# Patient Record
Sex: Female | Born: 1950 | Race: White | Hispanic: No | Marital: Married | Smoking: Former smoker
Health system: Southern US, Community
[De-identification: ages and names within clinical notes are randomized; demographics above are authoritative.]

## PROBLEM LIST (undated history)

## (undated) DIAGNOSIS — C9110 Chronic lymphocytic leukemia of B-cell type not having achieved remission: Secondary | ICD-10-CM

## (undated) DIAGNOSIS — I10 Essential (primary) hypertension: Secondary | ICD-10-CM

## (undated) DIAGNOSIS — I4891 Unspecified atrial fibrillation: Secondary | ICD-10-CM

## (undated) HISTORY — PX: WRIST SURGERY: SHX841

## (undated) HISTORY — PX: ABDOMINAL HYSTERECTOMY: SHX81

---

## 2018-06-02 ENCOUNTER — Inpatient Hospital Stay (HOSPITAL_COMMUNITY)
Admission: EM | Admit: 2018-06-02 | Discharge: 2018-06-07 | DRG: 378 | Disposition: A | Discharge status: Home / Self Care | Payer: Medicare Other | Attending: Internal Medicine | Admitting: Internal Medicine

## 2018-06-02 ENCOUNTER — Encounter (HOSPITAL_COMMUNITY): Payer: Self-pay | Admitting: Emergency Medicine

## 2018-06-02 ENCOUNTER — Other Ambulatory Visit: Payer: Self-pay

## 2018-06-02 ENCOUNTER — Emergency Department (HOSPITAL_COMMUNITY): Payer: Medicare Other

## 2018-06-02 DIAGNOSIS — C9111 Chronic lymphocytic leukemia of B-cell type in remission: Secondary | ICD-10-CM | POA: Diagnosis present

## 2018-06-02 DIAGNOSIS — K5721 Diverticulitis of large intestine with perforation and abscess with bleeding: Principal | ICD-10-CM | POA: Diagnosis present

## 2018-06-02 DIAGNOSIS — Z6841 Body Mass Index (BMI) 40.0 and over, adult: Secondary | ICD-10-CM

## 2018-06-02 DIAGNOSIS — K6389 Other specified diseases of intestine: Secondary | ICD-10-CM

## 2018-06-02 DIAGNOSIS — E876 Hypokalemia: Secondary | ICD-10-CM | POA: Diagnosis present

## 2018-06-02 DIAGNOSIS — I10 Essential (primary) hypertension: Secondary | ICD-10-CM | POA: Diagnosis present

## 2018-06-02 DIAGNOSIS — R0981 Nasal congestion: Secondary | ICD-10-CM | POA: Diagnosis present

## 2018-06-02 DIAGNOSIS — R109 Unspecified abdominal pain: Secondary | ICD-10-CM | POA: Diagnosis present

## 2018-06-02 DIAGNOSIS — D638 Anemia in other chronic diseases classified elsewhere: Secondary | ICD-10-CM | POA: Diagnosis present

## 2018-06-02 DIAGNOSIS — R7881 Bacteremia: Secondary | ICD-10-CM | POA: Diagnosis present

## 2018-06-02 DIAGNOSIS — K921 Melena: Secondary | ICD-10-CM | POA: Diagnosis not present

## 2018-06-02 DIAGNOSIS — Z79899 Other long term (current) drug therapy: Secondary | ICD-10-CM

## 2018-06-02 DIAGNOSIS — J45901 Unspecified asthma with (acute) exacerbation: Secondary | ICD-10-CM | POA: Diagnosis present

## 2018-06-02 DIAGNOSIS — Z90710 Acquired absence of both cervix and uterus: Secondary | ICD-10-CM

## 2018-06-02 DIAGNOSIS — I482 Chronic atrial fibrillation, unspecified: Secondary | ICD-10-CM | POA: Diagnosis present

## 2018-06-02 DIAGNOSIS — I119 Hypertensive heart disease without heart failure: Secondary | ICD-10-CM | POA: Diagnosis present

## 2018-06-02 DIAGNOSIS — R739 Hyperglycemia, unspecified: Secondary | ICD-10-CM | POA: Diagnosis present

## 2018-06-02 DIAGNOSIS — Z87891 Personal history of nicotine dependence: Secondary | ICD-10-CM

## 2018-06-02 DIAGNOSIS — R590 Localized enlarged lymph nodes: Secondary | ICD-10-CM | POA: Diagnosis present

## 2018-06-02 DIAGNOSIS — R000 Tachycardia, unspecified: Secondary | ICD-10-CM | POA: Diagnosis present

## 2018-06-02 DIAGNOSIS — Z8542 Personal history of malignant neoplasm of other parts of uterus: Secondary | ICD-10-CM

## 2018-06-02 DIAGNOSIS — I4891 Unspecified atrial fibrillation: Secondary | ICD-10-CM

## 2018-06-02 DIAGNOSIS — K5792 Diverticulitis of intestine, part unspecified, without perforation or abscess without bleeding: Secondary | ICD-10-CM

## 2018-06-02 DIAGNOSIS — R1084 Generalized abdominal pain: Secondary | ICD-10-CM

## 2018-06-02 DIAGNOSIS — Z7901 Long term (current) use of anticoagulants: Secondary | ICD-10-CM

## 2018-06-02 DIAGNOSIS — B967 Clostridium perfringens [C. perfringens] as the cause of diseases classified elsewhere: Secondary | ICD-10-CM | POA: Diagnosis present

## 2018-06-02 DIAGNOSIS — R112 Nausea with vomiting, unspecified: Secondary | ICD-10-CM | POA: Diagnosis present

## 2018-06-02 DIAGNOSIS — Z800 Family history of malignant neoplasm of digestive organs: Secondary | ICD-10-CM

## 2018-06-02 DIAGNOSIS — K5720 Diverticulitis of large intestine with perforation and abscess without bleeding: Secondary | ICD-10-CM | POA: Diagnosis not present

## 2018-06-02 DIAGNOSIS — Z7951 Long term (current) use of inhaled steroids: Secondary | ICD-10-CM

## 2018-06-02 HISTORY — DX: Unspecified atrial fibrillation: I48.91

## 2018-06-02 HISTORY — DX: Essential (primary) hypertension: I10

## 2018-06-02 HISTORY — DX: Chronic lymphocytic leukemia of B-cell type not having achieved remission: C91.10

## 2018-06-02 LAB — CBC
HCT: 35.6 % — ABNORMAL LOW (ref 36.0–46.0)
Hemoglobin: 11.1 g/dL — ABNORMAL LOW (ref 12.0–15.0)
MCH: 28.9 pg (ref 26.0–34.0)
MCHC: 31.2 g/dL (ref 30.0–36.0)
MCV: 92.7 fL (ref 80.0–100.0)
Platelets: 169 10*3/uL (ref 150–400)
RBC: 3.84 MIL/uL — ABNORMAL LOW (ref 3.87–5.11)
RDW: 14.5 % (ref 11.5–15.5)
WBC: 45 10*3/uL — ABNORMAL HIGH (ref 4.0–10.5)
nRBC: 0 % (ref 0.0–0.2)

## 2018-06-02 LAB — COMPREHENSIVE METABOLIC PANEL
ALT: 25 U/L (ref 0–44)
AST: 20 U/L (ref 15–41)
Albumin: 3.7 g/dL (ref 3.5–5.0)
Alkaline Phosphatase: 46 U/L (ref 38–126)
Anion gap: 13 (ref 5–15)
BUN: 16 mg/dL (ref 8–23)
CO2: 20 mmol/L — ABNORMAL LOW (ref 22–32)
Calcium: 8.8 mg/dL — ABNORMAL LOW (ref 8.9–10.3)
Chloride: 101 mmol/L (ref 98–111)
Creatinine, Ser: 1.01 mg/dL — ABNORMAL HIGH (ref 0.44–1.00)
GFR calc Af Amer: >60 mL/min (ref >60)
GFR, EST NON AFRICAN AMERICAN: 58 mL/min — AB (ref >60)
Glucose, Bld: 144 mg/dL — ABNORMAL HIGH (ref 70–99)
Potassium: 3.3 mmol/L — ABNORMAL LOW (ref 3.5–5.1)
Sodium: 134 mmol/L — ABNORMAL LOW (ref 135–145)
Total Bilirubin: 2 mg/dL — ABNORMAL HIGH (ref 0.3–1.2)
Total Protein: 5.9 g/dL — ABNORMAL LOW (ref 6.5–8.1)

## 2018-06-02 LAB — LIPASE, BLOOD: LIPASE: 21 U/L (ref 11–51)

## 2018-06-02 LAB — CBG MONITORING, ED: Glucose-Capillary: 132 mg/dL — ABNORMAL HIGH (ref 70–99)

## 2018-06-02 MED ORDER — SODIUM CHLORIDE 0.9 % IV BOLUS
1000.0000 mL | Freq: Once | INTRAVENOUS | Status: AC
  Administered 2018-06-02: 1000 mL via INTRAVENOUS

## 2018-06-02 MED ORDER — SODIUM CHLORIDE 0.9 % IV BOLUS
1000.0000 mL | Freq: Once | INTRAVENOUS | Status: AC
  Administered 2018-06-03: 1000 mL via INTRAVENOUS

## 2018-06-02 MED ORDER — MORPHINE SULFATE (PF) 4 MG/ML IV SOLN
4.0000 mg | Freq: Once | INTRAVENOUS | Status: AC
  Administered 2018-06-02: 4 mg via INTRAVENOUS
  Filled 2018-06-02: qty 1

## 2018-06-02 MED ORDER — IOHEXOL 300 MG/ML  SOLN
100.0000 mL | Freq: Once | INTRAMUSCULAR | Status: AC | PRN
  Administered 2018-06-02: 100 mL via INTRAVENOUS

## 2018-06-02 MED ORDER — SODIUM CHLORIDE 0.9% FLUSH
3.0000 mL | Freq: Once | INTRAVENOUS | Status: AC
  Administered 2018-06-02: 3 mL via INTRAVENOUS

## 2018-06-02 NOTE — ED Provider Notes (Signed)
[facility] EMERGENCY DEPARTMENT Provider Note   CSN: [id number] Arrival date & time: 06/02/18  2120     History   Chief Complaint Chief Complaint  Patient presents with  . Diarrhea  . Weakness    HPI Beth Alvarez is a 69 y.o. female.  68 y.o female with a PMH of Afib, CLL, Asthma, HTN presents to the ED with a chief complaint of diarrhea and weakness x 3 days. Patient is visiting from [place] for the ice skating competition and reports started feeling bad when she was arriving in [place].  Is here for the ice-skating competition, per friend states patient has had poor eating habits for the past 3 days has eaten only chips along with wine and had a late dinner last night at [employer].  And also had a couple glasses of wine last night.She's had multiple episodes of diarrhea but denies any blood in her stool. She reports pain all over her abdomen with no focal region. She has no previous history of abdominal surgeries aside from csection. She denies any nausea, vomiting, shortness of breath or other complaints.      Past Medical History:  Diagnosis Date  . Atrial fibrillation ([place])   . CLL (chronic lymphocytic leukemia) ([place])   . Hypertension     There are no active problems to display for this patient.   History reviewed. No pertinent surgical history.   OB History   No obstetric history on file.      Home Medications    Prior to Admission medications   Not on File    Family History No family history on file.  Social History Social History   Tobacco Use  . Smoking status: Former Research scientist (life sciences)  . Smokeless tobacco: Never Used  Substance Use Topics  . Alcohol use: Yes  . Drug use: Never     Allergies   Patient has no allergy information on record.   Review of Systems Review of Systems  Constitutional: Negative for chills and fever.  HENT: Negative for ear pain and sore throat.   Eyes: Negative for pain and visual disturbance.   Respiratory: Negative for cough and shortness of breath.   Cardiovascular: Negative for chest pain and palpitations.  Gastrointestinal: Positive for abdominal pain (generalized) and diarrhea (5+ episodes). Negative for nausea and vomiting.  Genitourinary: Negative for dysuria and hematuria.  Musculoskeletal: Negative for arthralgias and back pain.  Skin: Negative for color change and rash.  Neurological: Negative for seizures and syncope.  All other systems reviewed and are negative.    Physical Exam Updated Vital Signs BP (!) 105/54   Pulse (!) 121   Temp 99.9 F (37.7 C) (Oral)   Resp (!) 30   SpO2 98%   Physical Exam Vitals signs and nursing note reviewed.  Constitutional:      General: She is not in acute distress.    Appearance: She is well-developed.  HENT:     Head: Normocephalic and atraumatic.     Mouth/Throat:     Pharynx: No oropharyngeal exudate.  Eyes:     Pupils: Pupils are equal, round, and reactive to light.  Neck:     Musculoskeletal: Normal range of motion.  Cardiovascular:     Rate and Rhythm: Regular rhythm.     Heart sounds: Normal heart sounds.  Pulmonary:     Effort: Pulmonary effort is normal. No respiratory distress.     Breath sounds: Decreased breath sounds present. No wheezing.  Comments: Diminished breath sound throughout. No wheezing noted.  Chest:     Chest wall: No tenderness.  Abdominal:     General: Bowel sounds are normal. There is no distension.     Palpations: Abdomen is soft. There is no mass.     Tenderness: There is abdominal tenderness. There is no right CVA tenderness, left CVA tenderness, guarding or rebound.     Hernia: No hernia is present.  Musculoskeletal:        General: No tenderness or deformity.     Right lower leg: No edema.     Left lower leg: No edema.  Skin:    General: Skin is warm and dry.  Neurological:     Mental Status: She is alert and oriented to person, place, and time.      ED Treatments /  Results  Labs (all labs ordered are listed, but only abnormal results are displayed) Labs Reviewed  COMPREHENSIVE METABOLIC PANEL - Abnormal; Notable for the following components:      Result Value   Sodium 134 (*)    Potassium 3.3 (*)    CO2 20 (*)    Glucose, Bld 144 (*)    Creatinine, Ser 1.01 (*)    Calcium 8.8 (*)    Total Protein 5.9 (*)    Total Bilirubin 2.0 (*)    GFR calc non Af Amer 58 (*)    All other components within normal limits  CBC - Abnormal; Notable for the following components:   WBC 45.0 (*)    RBC 3.84 (*)    Hemoglobin 11.1 (*)    HCT 35.6 (*)    All other components within normal limits  CBG MONITORING, ED - Abnormal; Notable for the following components:   Glucose-Capillary 132 (*)    All other components within normal limits  LIPASE, BLOOD  URINALYSIS, ROUTINE W REFLEX MICROSCOPIC    EKG None  Radiology No results found.  Procedures Procedures (including critical care time)  Medications Ordered in ED Medications  sodium chloride 0.9 % bolus 1,000 mL (has no administration in time range)  sodium chloride flush (NS) 0.9 % injection 3 mL (3 mLs Intravenous Given 06/02/18 2200)  sodium chloride 0.9 % bolus 1,000 mL (1,000 mLs Intravenous New Bag/Given 06/02/18 2306)  morphine 4 MG/ML injection 4 mg (4 mg Intravenous Given 06/02/18 2308)  iohexol (OMNIPAQUE) 300 MG/ML solution 100 mL (100 mLs Intravenous Contrast Given 06/02/18 2354)     Initial Impression / Assessment and Plan / ED Course  I have reviewed the triage vital signs and the nursing notes.  Pertinent labs & imaging results that were available during my care of the patient were reviewed by me and considered in my medical decision making (see chart for details).    With a previous history of CLL, presents to the ED with a chief complaint of diarrhea x2 days.  Patient is here for the eyes getting competition reports she is had multiple episodes of diarrhea with no blood.  Friend at the  bedside reports patient has had poor dietary choices the past couple days eating only potato chips, they had dinner last night at [employer].  During evaluation patient has nonfocal abdominal pain reports her pain is generalized all over her abdomen.  Rates elevated at 121, provided with a bolus of fluids, slight decrease on her blood pressure.  Brought her with a second bolus of fluid.  MP shows slight decrease in potassium and sodium.  CBC showed a  leukocytosis of 45, according to patient's friend she has a history of CLL and has white blood cell count running along 35 at baseline.  CBG was 132.  Will obtain chest x-ray to rule out any acute process along with CT abdomen and pelvis.  Suspicion for any appendicitis has no focal point of tenderness and has been eating throughout the day.  Suspicion for a gastric enteritis, will obtain CT abdomen to further evaluate.  No previous history of diverticulitis, low suspicion for this as well. 12:17 AM Patient signed out to [place] PA at shift change.   Final Clinical Impressions(s) / ED Diagnoses   Final diagnoses:  Generalized abdominal pain  Tachycardia    ED Discharge Orders    None       Janeece Fitting, PA-C 06/03/18 0018    Julianne Rice, MD 06/10/18 [phone]

## 2018-06-02 NOTE — ED Provider Notes (Signed)
Patient care signed out at end of shift by Bennie Dallas, PA-C. Patient from [facility] here with diarrhea and diffuse abdominal pain for the past 4 days. No known fever, nausea or vomiting. No bloody stools. Patient with a history of CLL, asthma, uterine cancer, a-fib, chronic anticoagulation, HTN.   On exam: patient reported to be diffusely tender about abdomen without peritonitis. Tachycardic. WBC 45K  ("35K normal" with CLL); Fluids given with some improvement in tachycardia, blood pressure soft after fluids but stable.   Patient pending CT abd/pel and CXR at time of sign out.   Re-check:  Patient is uncomfortable, additional pain medications with Zofran ordered. BP 159 systolic, HR 458. EKG shows atrial fib with RVR. Cardizem ordered.  CT scan shows: IMPRESSION: Short segment of masslike asymmetric mucosal thickening of the sigmoid colon, containing foci of gas and extensive pericolonic inflammatory changes. This may potentially represent a diverticulitis with focal wall thickening and abscess formation, however the appearance is more suggestive of a necrotic primary colonic malignancy.  This abnormality abuts the superior serosal surface of the urinary bladder, which is potentially involved.  Pathologic lymphadenopathy along the bilateral pelvic wall, iliac chains and retroperitoneum.  Second 3 cm masslike outpouching off of the hepatic flexure of the colon, suspicious for malignancy.  8 mm too small to be actually characterize subtle hypoattenuated nodule in the right lobe of the liver.  11 mm indeterminate nodule off of the posterior cortex of the right kidney.   Findings were reviewed with Dr. Gilford Raid. Given her acute illness, favor diverticulitis in addition to the finding of mass concerning for CA. She is considered high risk, so Zosyn is ordered.   Lactic acid, blood cultures added.   Given infection, ongoing pain, atrial fib with RVR, patient  will need to be admitted for further care.   Discussed results of CT with patient and family. She is agreeable to admission. Regarding the mass (with second smaller area at hepatic flexure) she reports she was told she was found to have marker when being treated for uterine cancer than indicated a higher risk for colon CA. All questions of patient and family answered. Will discuss with hospitalist for admission.   Charlann Lange, PA-C 06/03/18 0146    Julianne Rice, MD 06/03/18 [phone]

## 2018-06-02 NOTE — ED Notes (Addendum)
Reports recent travel from [place]; has been having issues with her asthma since arriving to the area. Also reports generalized body aches and fever. MD aware of VS

## 2018-06-02 NOTE — ED Triage Notes (Addendum)
Pt is in town from [place] for ice skating.  C/o diarrhea, nausea, and generalized weakness since yesterday.  Hx of afib.

## 2018-06-02 NOTE — ED Notes (Signed)
Patient transported to CT 

## 2018-06-03 ENCOUNTER — Encounter (HOSPITAL_COMMUNITY): Payer: Self-pay | Admitting: Internal Medicine

## 2018-06-03 ENCOUNTER — Emergency Department (HOSPITAL_COMMUNITY): Payer: Medicare Other

## 2018-06-03 DIAGNOSIS — R112 Nausea with vomiting, unspecified: Secondary | ICD-10-CM | POA: Diagnosis present

## 2018-06-03 DIAGNOSIS — I4891 Unspecified atrial fibrillation: Secondary | ICD-10-CM | POA: Diagnosis present

## 2018-06-03 DIAGNOSIS — I482 Chronic atrial fibrillation, unspecified: Secondary | ICD-10-CM | POA: Diagnosis present

## 2018-06-03 DIAGNOSIS — Z6841 Body Mass Index (BMI) 40.0 and over, adult: Secondary | ICD-10-CM | POA: Diagnosis not present

## 2018-06-03 DIAGNOSIS — Z7901 Long term (current) use of anticoagulants: Secondary | ICD-10-CM | POA: Diagnosis not present

## 2018-06-03 DIAGNOSIS — K921 Melena: Secondary | ICD-10-CM | POA: Diagnosis not present

## 2018-06-03 DIAGNOSIS — B967 Clostridium perfringens [C. perfringens] as the cause of diseases classified elsewhere: Secondary | ICD-10-CM | POA: Diagnosis present

## 2018-06-03 DIAGNOSIS — J45901 Unspecified asthma with (acute) exacerbation: Secondary | ICD-10-CM | POA: Diagnosis present

## 2018-06-03 DIAGNOSIS — R109 Unspecified abdominal pain: Secondary | ICD-10-CM | POA: Diagnosis present

## 2018-06-03 DIAGNOSIS — K6389 Other specified diseases of intestine: Secondary | ICD-10-CM | POA: Diagnosis not present

## 2018-06-03 DIAGNOSIS — Z90710 Acquired absence of both cervix and uterus: Secondary | ICD-10-CM | POA: Diagnosis not present

## 2018-06-03 DIAGNOSIS — I10 Essential (primary) hypertension: Secondary | ICD-10-CM | POA: Diagnosis not present

## 2018-06-03 DIAGNOSIS — Z800 Family history of malignant neoplasm of digestive organs: Secondary | ICD-10-CM | POA: Diagnosis not present

## 2018-06-03 DIAGNOSIS — Z87891 Personal history of nicotine dependence: Secondary | ICD-10-CM | POA: Diagnosis not present

## 2018-06-03 DIAGNOSIS — K5792 Diverticulitis of intestine, part unspecified, without perforation or abscess without bleeding: Secondary | ICD-10-CM | POA: Diagnosis present

## 2018-06-03 DIAGNOSIS — R590 Localized enlarged lymph nodes: Secondary | ICD-10-CM | POA: Diagnosis present

## 2018-06-03 DIAGNOSIS — D638 Anemia in other chronic diseases classified elsewhere: Secondary | ICD-10-CM | POA: Diagnosis present

## 2018-06-03 DIAGNOSIS — R7881 Bacteremia: Secondary | ICD-10-CM | POA: Diagnosis present

## 2018-06-03 DIAGNOSIS — R739 Hyperglycemia, unspecified: Secondary | ICD-10-CM | POA: Diagnosis present

## 2018-06-03 DIAGNOSIS — I119 Hypertensive heart disease without heart failure: Secondary | ICD-10-CM | POA: Diagnosis present

## 2018-06-03 DIAGNOSIS — C9111 Chronic lymphocytic leukemia of B-cell type in remission: Secondary | ICD-10-CM | POA: Diagnosis present

## 2018-06-03 DIAGNOSIS — E876 Hypokalemia: Secondary | ICD-10-CM | POA: Diagnosis present

## 2018-06-03 DIAGNOSIS — Z8542 Personal history of malignant neoplasm of other parts of uterus: Secondary | ICD-10-CM | POA: Diagnosis not present

## 2018-06-03 DIAGNOSIS — K5720 Diverticulitis of large intestine with perforation and abscess without bleeding: Secondary | ICD-10-CM | POA: Diagnosis present

## 2018-06-03 DIAGNOSIS — Z79899 Other long term (current) drug therapy: Secondary | ICD-10-CM | POA: Diagnosis not present

## 2018-06-03 DIAGNOSIS — R0981 Nasal congestion: Secondary | ICD-10-CM | POA: Diagnosis present

## 2018-06-03 DIAGNOSIS — R000 Tachycardia, unspecified: Secondary | ICD-10-CM | POA: Diagnosis present

## 2018-06-03 DIAGNOSIS — R1030 Lower abdominal pain, unspecified: Secondary | ICD-10-CM | POA: Diagnosis not present

## 2018-06-03 DIAGNOSIS — K5721 Diverticulitis of large intestine with perforation and abscess with bleeding: Secondary | ICD-10-CM | POA: Diagnosis present

## 2018-06-03 LAB — BASIC METABOLIC PANEL
Anion gap: 13 (ref 5–15)
BUN: 17 mg/dL (ref 8–23)
CO2: 21 mmol/L — ABNORMAL LOW (ref 22–32)
Calcium: 7.9 mg/dL — ABNORMAL LOW (ref 8.9–10.3)
Chloride: 100 mmol/L (ref 98–111)
Creatinine, Ser: 1.11 mg/dL — ABNORMAL HIGH (ref 0.44–1.00)
GFR calc Af Amer: >60 mL/min (ref >60)
GFR calc non Af Amer: 52 mL/min — ABNORMAL LOW (ref >60)
GLUCOSE: 152 mg/dL — AB (ref 70–99)
Potassium: 4 mmol/L (ref 3.5–5.1)
Sodium: 134 mmol/L — ABNORMAL LOW (ref 135–145)

## 2018-06-03 LAB — CBC
HCT: 34.4 % — ABNORMAL LOW (ref 36.0–46.0)
HEMATOCRIT: 32.1 % — AB (ref 36.0–46.0)
Hemoglobin: 10 g/dL — ABNORMAL LOW (ref 12.0–15.0)
Hemoglobin: 10.8 g/dL — ABNORMAL LOW (ref 12.0–15.0)
MCH: 29.1 pg (ref 26.0–34.0)
MCH: 29.8 pg (ref 26.0–34.0)
MCHC: 31.2 g/dL (ref 30.0–36.0)
MCHC: 31.4 g/dL (ref 30.0–36.0)
MCV: 93.3 fL (ref 80.0–100.0)
MCV: 95 fL (ref 80.0–100.0)
Platelets: 155 10*3/uL (ref 150–400)
Platelets: 142 10*3/uL — ABNORMAL LOW (ref 150–400)
RBC: 3.44 MIL/uL — ABNORMAL LOW (ref 3.87–5.11)
RBC: 3.62 MIL/uL — ABNORMAL LOW (ref 3.87–5.11)
RDW: 14.7 % (ref 11.5–15.5)
RDW: 14.7 % (ref 11.5–15.5)
WBC: 45.8 10*3/uL — ABNORMAL HIGH (ref 4.0–10.5)
WBC: 46.2 10*3/uL — ABNORMAL HIGH (ref 4.0–10.5)
nRBC: 0 % (ref 0.0–0.2)
nRBC: 0 % (ref 0.0–0.2)

## 2018-06-03 LAB — CBC WITH DIFFERENTIAL/PLATELET
Basophils Absolute: 0 10*3/uL (ref 0.0–0.1)
Basophils Relative: 0 %
Eosinophils Absolute: 0 10*3/uL (ref 0.0–0.5)
Eosinophils Relative: 0 %
HCT: 32.6 % — ABNORMAL LOW (ref 36.0–46.0)
Hemoglobin: 10 g/dL — ABNORMAL LOW (ref 12.0–15.0)
Lymphocytes Relative: 33 %
Lymphs Abs: 16 10*3/uL — ABNORMAL HIGH (ref 0.7–4.0)
MCH: 29.4 pg (ref 26.0–34.0)
MCHC: 30.7 g/dL (ref 30.0–36.0)
MCV: 95.9 fL (ref 80.0–100.0)
METAMYELOCYTES PCT: 3 %
Monocytes Absolute: 1 10*3/uL (ref 0.1–1.0)
Monocytes Relative: 2 %
Neutro Abs: 31.5 10*3/uL — ABNORMAL HIGH (ref 1.7–7.7)
Neutrophils Relative %: 61 %
OTHER: 1 %
Platelets: 156 10*3/uL (ref 150–400)
RBC: 3.4 MIL/uL — AB (ref 3.87–5.11)
RDW: 14.8 % (ref 11.5–15.5)
WBC: 48.5 10*3/uL — ABNORMAL HIGH (ref 4.0–10.5)
nRBC: 0 % (ref 0.0–0.2)

## 2018-06-03 LAB — TROPONIN I
Troponin I: <0.03 ng/mL (ref <0.03)
Troponin I: <0.03 ng/mL (ref <0.03)

## 2018-06-03 LAB — LACTIC ACID, PLASMA
LACTIC ACID, VENOUS: 0.7 mmol/L (ref 0.5–1.9)
LACTIC ACID, VENOUS: 1.6 mmol/L (ref 0.5–1.9)
Lactic Acid, Venous: 2.1 mmol/L (ref 0.5–1.9)

## 2018-06-03 LAB — HEPATIC FUNCTION PANEL
ALT: 21 U/L (ref 0–44)
AST: 19 U/L (ref 15–41)
Albumin: 3.1 g/dL — ABNORMAL LOW (ref 3.5–5.0)
Alkaline Phosphatase: 38 U/L (ref 38–126)
Bilirubin, Direct: 0.6 mg/dL — ABNORMAL HIGH (ref 0.0–0.2)
Indirect Bilirubin: 1.7 mg/dL — ABNORMAL HIGH (ref 0.3–0.9)
Total Bilirubin: 2.3 mg/dL — ABNORMAL HIGH (ref 0.3–1.2)
Total Protein: 5.3 g/dL — ABNORMAL LOW (ref 6.5–8.1)

## 2018-06-03 LAB — HIV ANTIBODY (ROUTINE TESTING W REFLEX): HIV Screen 4th Generation wRfx: NONREACTIVE

## 2018-06-03 LAB — TYPE AND SCREEN
ABO/RH(D): O NEG
Antibody Screen: NEGATIVE

## 2018-06-03 LAB — ABO/RH: ABO/RH(D): O NEG

## 2018-06-03 LAB — MRSA PCR SCREENING: MRSA by PCR: NEGATIVE

## 2018-06-03 LAB — TSH: TSH: 2.322 u[IU]/mL (ref 0.350–4.500)

## 2018-06-03 MED ORDER — ONDANSETRON HCL 4 MG/2ML IJ SOLN: 4.0000 mg | Freq: Four times a day (QID) | INTRAMUSCULAR | Status: DC | PRN

## 2018-06-03 MED ORDER — ACETAMINOPHEN 650 MG RE SUPP: 650.0000 mg | Freq: Four times a day (QID) | RECTAL | Status: DC | PRN

## 2018-06-03 MED ORDER — ONDANSETRON HCL 4 MG/2ML IJ SOLN
4.0000 mg | Freq: Once | INTRAMUSCULAR | Status: AC
  Administered 2018-06-03: 4 mg via INTRAVENOUS
  Filled 2018-06-03: qty 2

## 2018-06-03 MED ORDER — ACETAMINOPHEN 325 MG PO TABS
650.0000 mg | ORAL_TABLET | Freq: Four times a day (QID) | ORAL | Status: DC | PRN
  Administered 2018-06-03: 650 mg via ORAL
  Filled 2018-06-03: qty 2

## 2018-06-03 MED ORDER — HEPARIN (PORCINE) 25000 UT/250ML-% IV SOLN: 1400.0000 [IU]/h | INTRAVENOUS | Status: DC

## 2018-06-03 MED ORDER — LEVALBUTEROL TARTRATE 45 MCG/ACT IN AERO
2.0000 | INHALATION_SPRAY | Freq: Four times a day (QID) | RESPIRATORY_TRACT | Status: DC
  Filled 2018-06-03: qty 15

## 2018-06-03 MED ORDER — MORPHINE SULFATE (PF) 2 MG/ML IV SOLN
2.0000 mg | INTRAVENOUS | Status: DC | PRN
  Administered 2018-06-03: 2 mg via INTRAVENOUS
  Filled 2018-06-03 (×4): qty 1

## 2018-06-03 MED ORDER — ONDANSETRON HCL 4 MG PO TABS: 4.0000 mg | ORAL_TABLET | Freq: Four times a day (QID) | ORAL | Status: DC | PRN

## 2018-06-03 MED ORDER — ACETAMINOPHEN 325 MG PO TABS
650.0000 mg | ORAL_TABLET | Freq: Four times a day (QID) | ORAL | Status: DC | PRN
  Administered 2018-06-03 – 2018-06-06 (×3): 650 mg via ORAL
  Filled 2018-06-03 (×4): qty 2

## 2018-06-03 MED ORDER — MORPHINE SULFATE (PF) 4 MG/ML IV SOLN
4.0000 mg | Freq: Once | INTRAVENOUS | Status: AC
  Administered 2018-06-03: 4 mg via INTRAVENOUS
  Filled 2018-06-03: qty 1

## 2018-06-03 MED ORDER — SODIUM CHLORIDE 0.9 % IV BOLUS
500.0000 mL | Freq: Once | INTRAVENOUS | Status: AC
  Administered 2018-06-03: 500 mL via INTRAVENOUS

## 2018-06-03 MED ORDER — LEVALBUTEROL HCL 0.63 MG/3ML IN NEBU: 0.6300 mg | INHALATION_SOLUTION | RESPIRATORY_TRACT | Status: DC | PRN

## 2018-06-03 MED ORDER — APIXABAN 5 MG PO TABS
5.0000 mg | ORAL_TABLET | Freq: Two times a day (BID) | ORAL | Status: DC
  Administered 2018-06-03 – 2018-06-05 (×5): 5 mg via ORAL
  Filled 2018-06-03 (×5): qty 1

## 2018-06-03 MED ORDER — UMECLIDINIUM BROMIDE 62.5 MCG/INH IN AEPB
1.0000 | INHALATION_SPRAY | Freq: Every day | RESPIRATORY_TRACT | Status: DC
  Administered 2018-06-03 – 2018-06-06 (×4): 1 via RESPIRATORY_TRACT
  Filled 2018-06-03 (×2): qty 7

## 2018-06-03 MED ORDER — PIPERACILLIN-TAZOBACTAM 3.375 G IVPB
3.3750 g | Freq: Three times a day (TID) | INTRAVENOUS | Status: DC
  Administered 2018-06-03 – 2018-06-04 (×4): 3.375 g via INTRAVENOUS
  Filled 2018-06-03 (×4): qty 50

## 2018-06-03 MED ORDER — PIPERACILLIN-TAZOBACTAM 3.375 G IVPB 30 MIN
3.3750 g | Freq: Once | INTRAVENOUS | Status: AC
  Administered 2018-06-03: 3.375 g via INTRAVENOUS
  Filled 2018-06-03: qty 50

## 2018-06-03 MED ORDER — LEVALBUTEROL HCL 0.63 MG/3ML IN NEBU: 0.6300 mg | INHALATION_SOLUTION | Freq: Four times a day (QID) | RESPIRATORY_TRACT | Status: DC | PRN

## 2018-06-03 MED ORDER — METOPROLOL TARTRATE 5 MG/5ML IV SOLN
5.0000 mg | Freq: Four times a day (QID) | INTRAVENOUS | Status: DC
  Administered 2018-06-03 – 2018-06-07 (×13): 5 mg via INTRAVENOUS
  Filled 2018-06-03 (×14): qty 5

## 2018-06-03 MED ORDER — LEVALBUTEROL HCL 0.63 MG/3ML IN NEBU
0.6300 mg | INHALATION_SOLUTION | Freq: Four times a day (QID) | RESPIRATORY_TRACT | Status: DC
  Administered 2018-06-04 (×2): 0.63 mg via RESPIRATORY_TRACT
  Filled 2018-06-03 (×3): qty 3

## 2018-06-03 MED ORDER — SODIUM CHLORIDE 0.9 % IV SOLN
INTRAVENOUS | Status: DC
  Administered 2018-06-03 (×3): via INTRAVENOUS

## 2018-06-03 MED ORDER — LEVALBUTEROL TARTRATE 45 MCG/ACT IN AERO: 2.0000 | INHALATION_SPRAY | Freq: Four times a day (QID) | RESPIRATORY_TRACT | Status: DC

## 2018-06-03 MED ORDER — MOMETASONE FURO-FORMOTEROL FUM 200-5 MCG/ACT IN AERO
2.0000 | INHALATION_SPRAY | Freq: Two times a day (BID) | RESPIRATORY_TRACT | Status: DC
  Administered 2018-06-03 – 2018-06-07 (×9): 2 via RESPIRATORY_TRACT
  Filled 2018-06-03 (×2): qty 8.8

## 2018-06-03 MED ORDER — DILTIAZEM HCL-DEXTROSE 100-5 MG/100ML-% IV SOLN (PREMIX)
5.0000 mg/h | INTRAVENOUS | Status: AC
  Administered 2018-06-03: 5 mg/h via INTRAVENOUS
  Administered 2018-06-03: 12.5 mg/h via INTRAVENOUS
  Administered 2018-06-03 – 2018-06-04 (×4): 15 mg/h via INTRAVENOUS
  Filled 2018-06-03 (×6): qty 100

## 2018-06-03 MED ORDER — ALBUTEROL SULFATE (2.5 MG/3ML) 0.083% IN NEBU: 2.5000 mg | INHALATION_SOLUTION | Freq: Four times a day (QID) | RESPIRATORY_TRACT | Status: DC

## 2018-06-03 MED ORDER — LATANOPROST 0.005 % OP SOLN
1.0000 [drp] | Freq: Every day | OPHTHALMIC | Status: DC
  Administered 2018-06-03 – 2018-06-07 (×5): 1 [drp] via OPHTHALMIC
  Filled 2018-06-03: qty 2.5

## 2018-06-03 MED ORDER — DILTIAZEM LOAD VIA INFUSION
10.0000 mg | Freq: Once | INTRAVENOUS | Status: AC
  Administered 2018-06-03: 10 mg via INTRAVENOUS
  Filled 2018-06-03: qty 10

## 2018-06-03 NOTE — ED Notes (Signed)
BRBPR in pt's stool.

## 2018-06-03 NOTE — H&P (Signed)
History and Physical    Beth Alvarez IRJ:[id number] DOB: Jul 29, 1950 DOA: 06/02/2018  PCP: System, Pcp Not In  Patient coming from: Home.  Chief Complaint: Abdominal pain.  HPI: Beth Alvarez is a 68 y.o. female with history of asthma, atrial fibrillation, hypertension, CLL and on observation visiting from [place] to attend a skating competition in [place] has been not feeling well last 2 days.  Patient states she was feeling mildly short of breath abdominal discomfort with some nausea.  Abdominal pain is mostly in the lower abdomen.  She has increased urgency to move the bowel but was not able to.  Has been having subjective feeling of fever chills.  No chest pain.  Has been having shortness of breath with wheezing typical of her asthma.  Patient has had previous surgery for uterine cancer in 2017 following which patient also had radiation.  ED Course: In the ER patient was in A. fib with RVR with significant leukocytosis more than her baseline.  Abdomen was tender diffusely but had no rigidity or rebound tenderness.  CT scan of the abdomen and pelvis done shows features concerning for sigmoid diverticulitis with abscess or colonic mass with possible metastasis.  Patient was started on Cardizem infusion for A. fib with RVR.  Zosyn for intra-abdominal possible abscess.  Admitted for further management.  Shortly after my exam patient had mild rectal bleeding.  Which was frank.  Patient did have one episode of vomiting after admission.  Nonbloody.  Review of Systems: As per HPI, rest all negative.   Past Medical History:  Diagnosis Date  . Atrial fibrillation ([place])   . CLL (chronic lymphocytic leukemia) ([place])   . Hypertension     Past Surgical History:  Procedure Laterality Date  . ABDOMINAL HYSTERECTOMY    . WRIST SURGERY       reports that she has quit smoking. She has never used smokeless tobacco. She reports current alcohol use. She reports that she does not use  drugs.  No Known Allergies  Family History  Problem Relation Age of Onset  . Arrhythmia Son   . Colon cancer Maternal Grandfather   . Diabetes Mellitus II Neg Hx     Prior to Admission medications   Medication Sig Start Date End Date Taking? Authorizing Provider  apixaban (ELIQUIS) 5 MG TABS tablet Take 5 mg by mouth 2 (two) times daily. 09/18/16  Yes [provider]  diltiazem (CARDIZEM CD) 240 MG 24 hr capsule Take 480 mg by mouth daily. 08/10/16  Yes [provider]  Fluticasone-Salmeterol (ADVAIR DISKUS) 500-50 MCG/DOSE AEPB Inhale 1 puff into the lungs 2 (two) times daily. 04/07/15  Yes [provider]  latanoprost (XALATAN) 0.005 % ophthalmic solution Place 1 drop into both eyes daily. 04/12/18  Yes [provider]  levalbuterol (XOPENEX HFA) 45 MCG/ACT inhaler Inhale 2 puffs into the lungs every 6 (six) hours. 07/23/17  Yes [provider]  losartan (COZAAR) 50 MG tablet Take 50 mg by mouth daily. 12/12/16  Yes [provider]  tiotropium (SPIRIVA) 18 MCG inhalation capsule Place 1 capsule into inhaler and inhale daily. 01/15/14  Yes [provider]    Physical Exam: Vitals:   06/03/18 0100 06/03/18 0130 06/03/18 0200 06/03/18 0230  BP: 107/75 104/62 (!) 108/59 (!) 114/58  Pulse: (!) 133 (!) 129 (!) 117 (!) 128  Resp: (!) 32 (!) 25 (!) 28 (!) 29  Temp:      TempSrc:      SpO2: 97% 93%  98% 99%      Constitutional: Moderately built and nourished. Vitals:   06/03/18 0100 06/03/18 0130 06/03/18 0200 06/03/18 0230  BP: 107/75 104/62 (!) 108/59 (!) 114/58  Pulse: (!) 133 (!) 129 (!) 117 (!) 128  Resp: (!) 32 (!) 25 (!) 28 (!) 29  Temp:      TempSrc:      SpO2: 97% 93% 98% 99%   Eyes: Anicteric no pallor. ENMT: No discharge from the ears eyes nose and mouth. Neck: No mass felt.  No neck rigidity. Respiratory: No rhonchi or crepitations. Cardiovascular: S1 S2 heard tachycardic. Abdomen: Mild tenderness diffusely  no rebound tenderness or guarding or rigidity.  Bowel sounds present. Musculoskeletal: No edema. Skin: No rash. Neurologic: Alert awake oriented to time place and person.  Moves all extremities. Psychiatric: Appears normal per normal affect.   Labs on Admission: I have personally reviewed following labs and imaging studies  CBC: Recent Labs  Lab 06/02/18 2149  WBC 45.0*  HGB 11.1*  HCT 35.6*  MCV 92.7  PLT 703   Basic Metabolic Panel: Recent Labs  Lab 06/02/18 2149  NA 134*  K 3.3*  CL 101  CO2 20*  GLUCOSE 144*  BUN 16  CREATININE 1.01*  CALCIUM 8.8*   GFR: CrCl cannot be calculated (Unknown ideal weight.). Liver Function Tests: Recent Labs  Lab 06/02/18 2149  AST 20  ALT 25  ALKPHOS 46  BILITOT 2.0*  PROT 5.9*  ALBUMIN 3.7   Recent Labs  Lab 06/02/18 2149  LIPASE 21   No results for input(s): AMMONIA in the last 168 hours. Coagulation Profile: No results for input(s): INR, PROTIME in the last 168 hours. Cardiac Enzymes: No results for input(s): CKTOTAL, CKMB, CKMBINDEX, TROPONINI in the last 168 hours. BNP (last 3 results) No results for input(s): PROBNP in the last 8760 hours. HbA1C: No results for input(s): HGBA1C in the last 72 hours. CBG: Recent Labs  Lab 06/02/18 2154  GLUCAP 132*   Lipid Profile: No results for input(s): CHOL, HDL, LDLCALC, TRIG, CHOLHDL, LDLDIRECT in the last 72 hours. Thyroid Function Tests: No results for input(s): TSH, T4TOTAL, FREET4, T3FREE, THYROIDAB in the last 72 hours. Anemia Panel: No results for input(s): VITAMINB12, FOLATE, FERRITIN, TIBC, IRON, RETICCTPCT in the last 72 hours. Urine analysis: No results found for: COLORURINE, APPEARANCEUR, LABSPEC, PHURINE, GLUCOSEU, HGBUR, BILIRUBINUR, KETONESUR, PROTEINUR, UROBILINOGEN, NITRITE, LEUKOCYTESUR Sepsis Labs: @LABRCNTIP (procalcitonin:4,lacticidven:4) )No results found for this or any previous visit (from the past 240 hour(s)).   Radiological Exams on  Admission: Dg Chest 2 View  Result Date: 06/03/2018 CLINICAL DATA:  Asthma. Body aches and fever. EXAM: CHEST - 2 VIEW COMPARISON:  None. FINDINGS: Mild cardiomegaly with normal mediastinal contours. Mild bronchitic change. No confluent airspace disease, pleural effusion or pneumothorax. No evidence of pulmonary edema. No acute osseous abnormalities. IMPRESSION: Cardiomegaly.  Mild bronchial thickening suggesting asthma. Electronically Signed   By: Keith Rake M.D.   On: 06/03/2018 00:35   Ct Abdomen Pelvis W Contrast  Result Date: 06/03/2018 CLINICAL DATA:  Abdominal pain with vomiting. EXAM: CT ABDOMEN AND PELVIS WITH CONTRAST TECHNIQUE: Multidetector CT imaging of the abdomen and pelvis was performed using the standard protocol following bolus administration of intravenous contrast. CONTRAST:  169mL OMNIPAQUE IOHEXOL 300 MG/ML  SOLN COMPARISON:  None. FINDINGS: Lower chest: No acute abnormality.  Small hiatal hernia. Hepatobiliary: 8 mm too small to be actually characterize hypoattenuated nodule in the right lobe of the liver. Probable subcapsular 11 mm cyst in the  left lobe of the liver. Normal gallbladder. Pancreas: Unremarkable. No pancreatic ductal dilatation or surrounding inflammatory changes. Spleen: Normal in size without focal abnormality. Adrenals/Urinary Tract: Normal adrenal glands. 11 mm hypoattenuated nodule, partially exophytic off of the inferior pole of the right kidney. No evidence of hydronephrosis or nephrolithiasis. Stomach/Bowel: Normal appearance of the stomach and small bowel. 3 cm masslike outpouching off of the serosal surface of the colon at the hepatic flexure. Short segment of masslike asymmetric mucosal thickening of the sigmoid colon measuring 6.7 x 5.9 cm. Foci of gas are noted within this area of thickening. There are extensive surrounding pericolonic inflammatory changes. This abnormality abuts the superior serosal surface of the urinary bladder which is also  thickened. Vascular/Lymphatic: Numerous enlarged lymph nodes along the pelvic floor, along the iliac chains and retroperitoneum. There are also scattered rounded prominent lymph nodes in the mesentery of the sigmoid colon. Reproductive: Status post hysterectomy. No adnexal masses. Other: No abdominal wall hernia or abnormality. No abdominopelvic ascites. Musculoskeletal: No acute or significant osseous findings. IMPRESSION: Short segment of masslike asymmetric mucosal thickening of the sigmoid colon, containing foci of gas and extensive pericolonic inflammatory changes. This may potentially represent a diverticulitis with focal wall thickening and abscess formation, however the appearance is more suggestive of a necrotic primary colonic malignancy. This abnormality abuts the superior serosal surface of the urinary bladder, which is potentially involved. Pathologic lymphadenopathy along the bilateral pelvic wall, iliac chains and retroperitoneum. Second 3 cm masslike outpouching off of the hepatic flexure of the colon, suspicious for malignancy. 8 mm too small to be actually characterize subtle hypoattenuated nodule in the right lobe of the liver. 11 mm indeterminate nodule off of the posterior cortex of the right kidney. Electronically Signed   By: Fidela Salisbury M.D.   On: 06/03/2018 00:42    EKG: Independently reviewed.  A. fib with RVR.  Assessment/Plan Principal Problem:   Abdominal pain Active Problems:   Colonic mass   Atrial fibrillation with RVR (HCC)   Asthma exacerbation   Essential hypertension    1. Abdominal pain with possibility of sigmoid diverticulitis with abscess or colonic mass with possible metastasis -CAT scan findings were reviewed.  For now patient will be kept n.p.o. we will get surgical consult.  May need GI input.  Patient on Zosyn IV fluids pain relief medications. 2. A. fib with RVR we will keep patient on Cardizem infusion.  Since patient is having some frank rectal  bleeding will hold off anticoagulation patient was explained about this. 3. Asthma exacerbation no obvious wheezing but patient is feeling mildly short of breath.  Closely observe.  We will keep patient on sliding as needed Xopenex.  Check troponins and also watch out for any fluid overload. 4. History of CLL on observation.  Patient's WBC has increased significantly from 38,008 in July 2019 in care everywhere.  Could be from stress and possible infection. 5. Anemia could be from blood loss.  Follow CBC.  Hemoglobin has decreased at least by 4 g from July 2019.  Holding anticoagulation for now. 6. Hypertension we will keep patient on PRN IV hydralazine since patient is n.p.o.   DVT prophylaxis: SCDs since patient is having rectal bleeding. Code Status: Full code. Family Communication: Discussed with patient. Disposition Plan: Home. Consults called: We will consult general surgery.  May need GI. Admission status: Inpatient.   Rise Patience MD [facility] Pager [phone].  If 7PM-7AM, please contact night-coverage [email or web address] Password [facility]  06/03/2018, 3:48  AM

## 2018-06-03 NOTE — ED Notes (Signed)
Pt placed in hospital bed for comfort.

## 2018-06-03 NOTE — ED Notes (Signed)
Dr. Raliegh Ip informed of pt's lactic acid via secure message.

## 2018-06-03 NOTE — Progress Notes (Signed)
ANTICOAGULATION CONSULT NOTE - Initial Consult  Pharmacy Consult for heparin and zosyn Indication: atrial fibrillation, intra abdominal infection  No Known Allergies  Patient Measurements: Weight: 285 lb (129.3 kg) Heparin Dosing Weight: 90.7 kg  Vital Signs: Temp: 99.9 F (37.7 C) (01/26 2129) Temp Source: Oral (01/26 2129) BP: 119/67 (01/27 0445) Pulse Rate: 124 (01/27 0445)  Labs: Recent Labs    06/02/18 2149  HGB 11.1*  HCT 35.6*  PLT 169  CREATININE 1.01*    CrCl cannot be calculated (Unknown ideal weight.).   Medical History: Past Medical History:  Diagnosis Date  . Atrial fibrillation ([place])   . CLL (chronic lymphocytic leukemia) ([place])   . Hypertension     Medications:  eliquis  Assessment: 68 yo lady to start zosyn for intra abdominal infection.  Heparin will be started in place of eliquis for afib. Goal of Therapy:  APTT 66-102 sec Heparin level 0.3-0.7 units/ml Monitor platelets by anticoagulation protocol: Yes   Plan:  Heparin drip at 1400 units/hr Check heparin level and aPTT 6 hours after start and daily CBC daily Monitor for bleeding complications Zosyn extended interval  Beth Alvarez 06/03/2018,4:59 AM

## 2018-06-03 NOTE — Progress Notes (Signed)
Beth Alvarez is a 68 y.o. female patient admitted from ED awake, alert - oriented  X 4 - no acute distress noted.  VSS - Blood pressure (!) 114/59, pulse (!) 110, temperature 98.4 F (36.9 C), temperature source Oral, resp. rate 20, height 5\' 6"  (1.676 m), weight 129.3 kg, SpO2 97 %.    IV in place, occlusive dsg intact without redness.  Orientation to room, and floor completed. Admission INP armband ID verified with patient/family, and in place.   Fall and skin assessment complete, with patient and able to verbalize understanding of risk associated with falls, and verbalized understanding to call nsg before up out of bed.  Call light within reach, patient able to voice, and demonstrate understanding.  Skin, clean-dry- intact without evidence of bruising, or skin tears.   No evidence of skin break down noted on exam.     Will cont to eval and treat per MD orders.  Patience Musca, RN 06/03/2018 7:55 PM

## 2018-06-03 NOTE — Consult Note (Signed)
Beth Alvarez May 18, 1950  [id number].    Requesting MD: Dr. Hal Hope Chief Complaint: Fever, chills, generalized body aches, diarrhea Reason for Consult: CT with diverticulitis vs colonic malignancy   HPI:  This is a 68 year old female with a history of CLL/SLL (Dx 2009 followed by Dr. 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   Patient reports that on Wednesday, 1/22 she came down from [place] with a friend she wants the ice-skating in [place].  She notes for the 5 days prior to this she had been having more loose, less formed stool but has been otherwise was asymptomatic.  On Saturday, 1/25 she had one episode of non-bloody/non-melanous diarrhea.  She reports on 1/26 when she awoke she felt like a "bus hit me".  She reports having a fever, chills, body aches, nasal congestion, feeling short of breath, dry cough and also lower abdominal pain with greater than 5 episodes of nonbloody diarrhea.  There was no chest pain or urinary symptoms associated with this.  She presented to the emergency department where she was found to be in A. fib and RVR and started on a Cardizem infusion.  She had a CT scan of her abdomen and pelvis done that shows potential diverticulitis with focal wall thickening and abscess formation versus a necrotic primary colonic malignancy.  This also showed that this may involve the superior surface of the bladder.  There is noted pathologic lymphadenopathy of the pelvic wall, iliac chains and retroperitoneum.  There was a 3 cm masslike outpouching of the hepatic flexure and a 8 mm hypoattenuated nodule to the right of the liver. Her WBC was 45 (baseline 30k-50k per care everywhere). She was admitted to the medicine service and started on Zosyn.  We are asked to consult.  She denies history of diverticulitis in the past.  She is  never had a colonoscopy.  She denies any recent antibiotic use or recent admissions to the hospital.  No sick contacts.  She did not receive a flu vaccine this year.  Since arrival vitals are with Tmax 101.2, HR 106-147, RR 22-34, BP 94/52 - 154/118. Lactic acid initially 0.7; max 2.1 this AM. Blood cx pending. She has had 2 episodes of nonbloody, nonbilious emesis.  She notes some nausea.  She has had 2 episodes of bloody diarrhea which is new.  She continues to pass flatus.  Her abdominal pain has not changed in location or characteristic.   ROS: Review of Systems  Constitutional: Positive for chills, fever and malaise/fatigue.  HENT: Positive for congestion. Negative for ear discharge and sore throat.   Eyes: Negative.   Respiratory: Positive for cough, shortness of breath and wheezing. Negative for hemoptysis.   Cardiovascular: Negative for chest pain, leg swelling and PND.  Gastrointestinal: Positive for abdominal pain, blood in stool, diarrhea, nausea and vomiting.  Genitourinary: Negative.   Musculoskeletal: Positive for myalgias. Negative for neck pain.  Skin: Negative.   Neurological: Negative.   All other systems reviewed and are negative.   Family History  Problem Relation Age of Onset  . Arrhythmia Son   . Colon cancer Maternal Grandfather   . Diabetes Mellitus II Neg Hx     Past Medical History:  Diagnosis Date  . Atrial fibrillation ([place])   . CLL (chronic lymphocytic leukemia) ([place])   . Hypertension     Past Surgical History:  Procedure Laterality  Date  . ABDOMINAL HYSTERECTOMY    . WRIST SURGERY      Social History:  reports that she has quit smoking. She has never used smokeless tobacco. She reports current alcohol use. She reports that she does not use drugs.  Allergies: No Known Allergies  (Not in a hospital admission)    Physical Exam: Blood pressure (!) 108/53, pulse (!) 106, temperature (!) 101.2 F (38.4 C), temperature source Oral, resp. rate (!)  24, height 5\' 6"  (1.676 m), weight 129.3 kg, SpO2 92 %. General: pleasant, WD, WN white female who is laying in bed in NAD HEENT: head is normocephalic, atraumatic.  Sclera are noninjected.  PERRL.  Ears and nose without any masses or lesions.  Mouth is pink and moist Heart: Irregularly, irregular.  Palpable radial and pedal pulses bilaterally Lungs: Faint expiratory wheezing globally. No rhonchi, or rales noted.  Respiratory effort nonlabored Abd: soft, ND, +BS. Tenderness of the lower abdomen without focal tenderness. No r/r/g. No masses, hernias, or organomegaly MS: all 4 extremities are symmetrical with no cyanosis, clubbing, or edema. Skin: warm and dry with no masses, lesions, or rashes Psych: A&Ox3 with an appropriate affect.  Results for orders placed or performed during the hospital encounter of 06/02/18 (from the past 48 hour(s))  Lipase, blood     Status: None   Collection Time: 06/02/18  9:49 PM  Result Value Ref Range   Lipase 21 11 - 51 U/L    Comment: Performed at [facility], [place] [address] [place], [place] [address]  Comprehensive metabolic panel     Status: Abnormal   Collection Time: 06/02/18  9:49 PM  Result Value Ref Range   Sodium 134 (L) 135 - 145 mmol/L   Potassium 3.3 (L) 3.5 - 5.1 mmol/L   Chloride 101 98 - 111 mmol/L   CO2 20 (L) 22 - 32 mmol/L   Glucose, Bld 144 (H) 70 - 99 mg/dL   BUN 16 8 - 23 mg/dL   Creatinine, Ser 1.01 (H) 0.44 - 1.00 mg/dL   Calcium 8.8 (L) 8.9 - 10.3 mg/dL   Total Protein 5.9 (L) 6.5 - 8.1 g/dL   Albumin 3.7 3.5 - 5.0 g/dL   AST 20 15 - 41 U/L   ALT 25 0 - 44 U/L   Alkaline Phosphatase 46 38 - 126 U/L   Total Bilirubin 2.0 (H) 0.3 - 1.2 mg/dL   GFR calc non Af Amer 58 (L) >60 mL/min   GFR calc Af Amer >60 >60 mL/min   Anion gap 13 5 - 15    Comment: Performed at [facility], [place] [address], [place], [place] [address]  CBC     Status: Abnormal   Collection Time: 06/02/18  9:49 PM  Result Value Ref Range   WBC  45.0 (H) 4.0 - 10.5 K/uL   RBC 3.84 (L) 3.87 - 5.11 MIL/uL   Hemoglobin 11.1 (L) 12.0 - 15.0 g/dL   HCT 35.6 (L) 36.0 - 46.0 %   MCV 92.7 80.0 - 100.0 fL   MCH 28.9 26.0 - 34.0 pg   MCHC 31.2 30.0 - 36.0 g/dL   RDW 14.5 11.5 - 15.5 %   Platelets 169 150 - 400 K/uL   nRBC 0.0 0.0 - 0.2 %    Comment: Performed at [facility], [place] [address] [place], [place] [address]  CBG monitoring, ED     Status: Abnormal   Collection Time: 06/02/18  9:54  PM  Result Value Ref Range   Glucose-Capillary 132 (H) 70 - 99 mg/dL   Comment 1 Notify RN    Comment 2 Document in Chart   Lactic acid, plasma     Status: None   Collection Time: 06/03/18  1:30 AM  Result Value Ref Range   Lactic Acid, Venous 0.7 0.5 - 1.9 mmol/L    Comment: Performed at [facility] [address], [place], [place] [address]  Hepatic function panel     Status: Abnormal   Collection Time: 06/03/18  5:00 AM  Result Value Ref Range   Total Protein 5.3 (L) 6.5 - 8.1 g/dL   Albumin 3.1 (L) 3.5 - 5.0 g/dL   AST 19 15 - 41 U/L   ALT 21 0 - 44 U/L   Alkaline Phosphatase 38 38 - 126 U/L   Total Bilirubin 2.3 (H) 0.3 - 1.2 mg/dL   Bilirubin, Direct 0.6 (H) 0.0 - 0.2 mg/dL   Indirect Bilirubin 1.7 (H) 0.3 - 0.9 mg/dL    Comment: Performed at [facility] [address], [place], [place] [address]  Basic metabolic panel     Status: Abnormal   Collection Time: 06/03/18  5:00 AM  Result Value Ref Range   Sodium 134 (L) 135 - 145 mmol/L   Potassium 4.0 3.5 - 5.1 mmol/L    Comment: DELTA CHECK NOTED   Chloride 100 98 - 111 mmol/L   CO2 21 (L) 22 - 32 mmol/L   Glucose, Bld 152 (H) 70 - 99 mg/dL   BUN 17 8 - 23 mg/dL   Creatinine, Ser 1.11 (H) 0.44 - 1.00 mg/dL   Calcium 7.9 (L) 8.9 - 10.3 mg/dL   GFR calc non Af Amer 52 (L) >60 mL/min   GFR calc Af Amer >60 >60 mL/min   Anion gap 13 5 - 15    Comment: Performed at [facility], [place] [address], [place], [place] [address]  TSH     Status: None    Collection Time: 06/03/18  5:00 AM  Result Value Ref Range   TSH 2.322 0.350 - 4.500 uIU/mL    Comment: Performed by a 3rd Generation assay with a functional sensitivity of <=0.01 uIU/mL. Performed at [facility], [place] [address] [place], [place] [address]   Troponin I - Now Then Q6H     Status: None   Collection Time: 06/03/18  5:00 AM  Result Value Ref Range   Troponin I <0.03 <0.03 ng/mL    Comment: Performed at [facility] [address] [place],  [address]  CBC WITH DIFFERENTIAL     Status: Abnormal   Collection Time: 06/03/18  5:00 AM  Result Value Ref Range   WBC 48.5 (H) 4.0 - 10.5 K/uL    Comment: REPEATED TO VERIFY   RBC 3.40 (L) 3.87 - 5.11 MIL/uL   Hemoglobin 10.0 (L) 12.0 - 15.0 g/dL   HCT 32.6 (L) 36.0 - 46.0 %   MCV 95.9 80.0 - 100.0 fL   MCH 29.4 26.0 - 34.0 pg   MCHC 30.7 30.0 - 36.0 g/dL   RDW 14.8 11.5 - 15.5 %   Platelets 156 150 - 400 K/uL   nRBC 0.0 0.0 - 0.2 %   Neutrophils Relative % 61 %   Neutro Abs 31.5 (H) 1.7 - 7.7 K/uL   Lymphocytes Relative 33 %   Lymphs Abs 16.0 (H) 0.7 - 4.0 K/uL  Monocytes Relative 2 %   Monocytes Absolute 1.0 0.1 - 1.0 K/uL   Eosinophils Relative 0 %   Eosinophils Absolute 0.0 0.0 - 0.5 K/uL   Basophils Relative 0 %   Basophils Absolute 0.0 0.0 - 0.1 K/uL   WBC Morphology DOHLE BODIES     Comment: SENT TO PATHOLOGY FOR REVIEW   RBC Morphology POLYCHROMASIA PRESENT    Other 1 %   Metamyelocytes Relative 3 %    Comment: Performed at [facility] [address], [place], [place] [address]  Lactic acid, plasma     Status: Abnormal   Collection Time: 06/03/18  5:01 AM  Result Value Ref Range   Lactic Acid, Venous 2.1 (HH) 0.5 - 1.9 mmol/L    Comment: CRITICAL RESULT CALLED TO, READ BACK BY AND VERIFIED WITH: [facility] RN 06/03/2018 0037 JORDANS Performed at [facility], [place] [address] [place] [address], [place], [place] [address]   Lactic acid, plasma     Status: None   Collection Time: 06/03/18  6:11 AM   Result Value Ref Range   Lactic Acid, Venous 1.6 0.5 - 1.9 mmol/L    Comment: Performed at [facility] [address], [place], [place] [address]  Type and screen [place]     Status: None   Collection Time: 06/03/18  6:20 AM  Result Value Ref Range   ABO/RH(D) O NEG    Antibody Screen NEG    Sample Expiration      06/06/2018 Performed at [facility], [place] [address] [place], [place] [address]    Dg Chest 2 View  Result Date: 06/03/2018 CLINICAL DATA:  Asthma. Body aches and fever. EXAM: CHEST - 2 VIEW COMPARISON:  None. FINDINGS: Mild cardiomegaly with normal mediastinal contours. Mild bronchitic change. No confluent airspace disease, pleural effusion or pneumothorax. No evidence of pulmonary edema. No acute osseous abnormalities. IMPRESSION: Cardiomegaly.  Mild bronchial thickening suggesting asthma. Electronically Signed   By: Keith Rake M.D.   On: 06/03/2018 00:35   Ct Abdomen Pelvis W Contrast  Result Date: 06/03/2018 CLINICAL DATA:  Abdominal pain with vomiting. EXAM: CT ABDOMEN AND PELVIS WITH CONTRAST TECHNIQUE: Multidetector CT imaging of the abdomen and pelvis was performed using the standard protocol following bolus administration of intravenous contrast. CONTRAST:  134mL OMNIPAQUE IOHEXOL 300 MG/ML  SOLN COMPARISON:  None. FINDINGS: Lower chest: No acute abnormality.  Small hiatal hernia. Hepatobiliary: 8 mm too small to be actually characterize hypoattenuated nodule in the right lobe of the liver. Probable subcapsular 11 mm cyst in the left lobe of the liver. Normal gallbladder. Pancreas: Unremarkable. No pancreatic ductal dilatation or surrounding inflammatory changes. Spleen: Normal in size without focal abnormality. Adrenals/Urinary Tract: Normal adrenal glands. 11 mm hypoattenuated nodule, partially exophytic off of the inferior pole of the right kidney. No evidence of hydronephrosis or nephrolithiasis. Stomach/Bowel: Normal appearance of  the stomach and small bowel. 3 cm masslike outpouching off of the serosal surface of the colon at the hepatic flexure. Short segment of masslike asymmetric mucosal thickening of the sigmoid colon measuring 6.7 x 5.9 cm. Foci of gas are noted within this area of thickening. There are extensive surrounding pericolonic inflammatory changes. This abnormality abuts the superior serosal surface of the urinary bladder which is also thickened. Vascular/Lymphatic: Numerous enlarged lymph nodes along the pelvic floor, along the iliac chains and retroperitoneum. There are also scattered rounded prominent lymph nodes in the mesentery of the sigmoid colon. Reproductive: Status post  hysterectomy. No adnexal masses. Other: No abdominal wall hernia or abnormality. No abdominopelvic ascites. Musculoskeletal: No acute or significant osseous findings. IMPRESSION: Short segment of masslike asymmetric mucosal thickening of the sigmoid colon, containing foci of gas and extensive pericolonic inflammatory changes. This may potentially represent a diverticulitis with focal wall thickening and abscess formation, however the appearance is more suggestive of a necrotic primary colonic malignancy. This abnormality abuts the superior serosal surface of the urinary bladder, which is potentially involved. Pathologic lymphadenopathy along the bilateral pelvic wall, iliac chains and retroperitoneum. Second 3 cm masslike outpouching off of the hepatic flexure of the colon, suspicious for malignancy. 8 mm too small to be actually characterize subtle hypoattenuated nodule in the right lobe of the liver. 11 mm indeterminate nodule off of the posterior cortex of the right kidney. Electronically Signed   By: Fidela Salisbury M.D.   On: 06/03/2018 00:42    . sodium chloride Stopped (06/03/18 0703)  . diltiazem (CARDIZEM) infusion 5 mg/hr (06/03/18 0150)  . piperacillin-tazobactam (ZOSYN)  IV       Assessment/Plan A. Fib with RVR on Chronic  Eliquis  CLL/SLL (Dx 2009 followed by Dr. Gaylyn Cheers at [facility] in [place], stage 0 - no current tx) Endometrial adenocarcinoma (2017) s/p TAH/BSO and radiation Hypertension  Asthma  Diverticulitis vs Malignancy  -Will tx as diverticulitis -Okay for heparin, hold Eliquis  -Will need colonoscopy in 6-8 weeks if improves -No acute surgical interventions at this time -We will follow   FEN - Npo, sips with meds, IVF VTE - SCD, okay for heparin from a surgical standpoint ID - Zosyn >>  Jillyn Ledger, [facility] Surgery 06/03/2018, 8:24 AM Pager: [phone]

## 2018-06-03 NOTE — Progress Notes (Signed)
[place] TEAM 1 - Stepdown/ICU TEAM  Zamoria Boss  VOJ:[id number] DOB: February 22, 1951 DOA: 06/02/2018 PCP: System, Pcp Not In    Brief Narrative:  68 y.o. female with a hx of of asthma, atrial fibrillation, hypertension, CLL, and uterine cancer s/p TAH w/ XRT who is visiting from [place]. She had not been feeling well for 2 days with mild shortness of breath and abdominal discomfort with some nausea and chills.   In the ER she was found to be in in A. fib with RVR with significant leukocytosis. Abdomen was tender diffusely but w/ no rigidity or rebound tenderness. CT abdom/pelvis noted features concerning for sigmoid diverticulitis with abscess or colonic mass with possible metastasis.    Significant Events: 1/27 admit   Subjective: Pt is seen for a f/u visit.    Assessment & Plan:  Abdom pain - Acute diverticulitis   2 potential masses/phlegmon 1 in sigmoid colon / 1 of hepatic flexure - for outpt colo back home in [place] once diverticulitis stable enough to allow   Chronic Afib w/ acute RVR  Asthma   CLL Currently on observation   Anemia   HTN   Hyperglycemia   DVT prophylaxis: eliquis  Code Status: FULL CODE Family Communication: spoke w/ sister at bedside  Disposition Plan: SDU   Consultants:  Gen Surgery   Antimicrobials:  Zosyn 1/26 >   Objective: Blood pressure 121/60, pulse (!) 115, temperature 99.2 F (37.3 C), temperature source Oral, resp. rate (!) 22, height 5\' 6"  (1.676 m), weight 129.3 kg, SpO2 97 %.  Intake/Output Summary (Last 24 hours) at 06/03/2018 1356 Last data filed at 06/03/2018 0703 Gross per 24 hour  Intake 2550 ml  Output -  Net 2550 ml   Filed Weights   06/03/18 0442  Weight: 129.3 kg    Examination: Pt was seen for a f/u visit.    CBC: Recent Labs  Lab 06/02/18 2149 06/03/18 0500 06/03/18 0901 06/03/18 1205  WBC 45.0* 48.5* 45.8* 46.2*  NEUTROABS  --  31.5*  --   --   HGB 11.1* 10.0* 10.0* 10.8*  HCT 35.6* 32.6*  32.1* 34.4*  MCV 92.7 95.9 93.3 95.0  PLT 169 156 155 993*   Basic Metabolic Panel: Recent Labs  Lab 06/02/18 2149 06/03/18 0500  NA 134* 134*  K 3.3* 4.0  CL 101 100  CO2 20* 21*  GLUCOSE 144* 152*  BUN 16 17  CREATININE 1.01* 1.11*  CALCIUM 8.8* 7.9*   GFR: Estimated Creatinine Clearance: 69.6 mL/min (A) (by C-G formula based on SCr of 1.11 mg/dL (H)).  Liver Function Tests: Recent Labs  Lab 06/02/18 2149 06/03/18 0500  AST 20 19  ALT 25 21  ALKPHOS 46 38  BILITOT 2.0* 2.3*  PROT 5.9* 5.3*  ALBUMIN 3.7 3.1*   Recent Labs  Lab 06/02/18 2149  LIPASE 21    Cardiac Enzymes: Recent Labs  Lab 06/03/18 0500 06/03/18 0901  TROPONINI <0.03 <0.03    HbA1C: No results found for: HGBA1C  CBG: Recent Labs  Lab 06/02/18 2154  GLUCAP 132*     Scheduled Meds: . latanoprost  1 drop Both Eyes Daily  . mometasone-formoterol  2 puff Inhalation BID  . umeclidinium bromide  1 puff Inhalation Daily   Continuous Infusions: . sodium chloride 125 mL/hr at 06/03/18 0846  . diltiazem (CARDIZEM) infusion 12.5 mg/hr (06/03/18 1309)  . piperacillin-tazobactam (ZOSYN)  IV 3.375 g (06/03/18 1017)     LOS: 0 days   Time spent:  No Charge  Cherene Altes, MD [facility] Office  [phone] Pager - Text Page per Amion as per below:  On-Call/Text Page:      [email or web address]  If 7PM-7AM, please contact night-coverage [email or web address] 06/03/2018, 1:56 PM

## 2018-06-03 NOTE — Progress Notes (Addendum)
PHARMACY - PHYSICIAN COMMUNICATION CRITICAL VALUE ALERT - BLOOD CULTURE IDENTIFICATION (BCID)  Beth Alvarez is an 68 y.o. female who presented to [facility] on 06/02/2018 with a chief complaint of abd pain.   Assessment:  Pt with CLL who was admitted for SOB and abd discomfort. Labs called with culture result of 1/4 bottles with GPR. She was started on Zosyn empirically for diverticulitis. These are likely to be a contaminant.   Name of physician (or Provider) ContactedThereasa Solo  Current antibiotics: Zosyn  Changes to prescribed antibiotics recommended:  Cont zosyn  Onnie Boer, PharmD, [place], AAHIVP, CPP Infectious Disease Pharmacist 06/03/2018 2:37 PM   Addendum:  Labs came back with clostridium perfringens in both anaerobic bottles today. These are very sens to PCN. Ok to narrow to Unasyn per Dr. Dorris Fetch, PharmD, BCIDP, AAHIVP, CPP Infectious Disease Pharmacist 06/04/2018 2:44 PM

## 2018-06-04 DIAGNOSIS — K5792 Diverticulitis of intestine, part unspecified, without perforation or abscess without bleeding: Secondary | ICD-10-CM

## 2018-06-04 LAB — COMPREHENSIVE METABOLIC PANEL
ALK PHOS: 44 U/L (ref 38–126)
ALT: 20 U/L (ref 0–44)
AST: 15 U/L (ref 15–41)
Albumin: 2.7 g/dL — ABNORMAL LOW (ref 3.5–5.0)
Anion gap: 12 (ref 5–15)
BUN: 15 mg/dL (ref 8–23)
CO2: 22 mmol/L (ref 22–32)
Calcium: 7.8 mg/dL — ABNORMAL LOW (ref 8.9–10.3)
Chloride: 100 mmol/L (ref 98–111)
Creatinine, Ser: 0.9 mg/dL (ref 0.44–1.00)
GFR calc Af Amer: >60 mL/min (ref >60)
GFR calc non Af Amer: >60 mL/min (ref >60)
Glucose, Bld: 128 mg/dL — ABNORMAL HIGH (ref 70–99)
Potassium: 3.3 mmol/L — ABNORMAL LOW (ref 3.5–5.1)
Sodium: 134 mmol/L — ABNORMAL LOW (ref 135–145)
Total Bilirubin: 1.7 mg/dL — ABNORMAL HIGH (ref 0.3–1.2)
Total Protein: 5 g/dL — ABNORMAL LOW (ref 6.5–8.1)

## 2018-06-04 LAB — CBC
HCT: 30.3 % — ABNORMAL LOW (ref 36.0–46.0)
HEMOGLOBIN: 9.7 g/dL — AB (ref 12.0–15.0)
MCH: 30.3 pg (ref 26.0–34.0)
MCHC: 32 g/dL (ref 30.0–36.0)
MCV: 94.7 fL (ref 80.0–100.0)
Platelets: 133 10*3/uL — ABNORMAL LOW (ref 150–400)
RBC: 3.2 MIL/uL — ABNORMAL LOW (ref 3.87–5.11)
RDW: 14.7 % (ref 11.5–15.5)
WBC: 37.1 10*3/uL — ABNORMAL HIGH (ref 4.0–10.5)
nRBC: 0 % (ref 0.0–0.2)

## 2018-06-04 LAB — PATHOLOGIST SMEAR REVIEW

## 2018-06-04 LAB — MAGNESIUM: Magnesium: 1.8 mg/dL (ref 1.7–2.4)

## 2018-06-04 MED ORDER — POTASSIUM CHLORIDE 10 MEQ/100ML IV SOLN
10.0000 meq | INTRAVENOUS | Status: AC
  Administered 2018-06-04 – 2018-06-05 (×2): 10 meq via INTRAVENOUS
  Filled 2018-06-04 (×2): qty 100

## 2018-06-04 MED ORDER — MAGNESIUM SULFATE 2 GM/50ML IV SOLN
2.0000 g | Freq: Once | INTRAVENOUS | Status: AC
  Administered 2018-06-04: 2 g via INTRAVENOUS
  Filled 2018-06-04: qty 50

## 2018-06-04 MED ORDER — MORPHINE SULFATE (PF) 2 MG/ML IV SOLN
1.0000 mg | INTRAVENOUS | Status: DC | PRN
  Filled 2018-06-04: qty 1

## 2018-06-04 MED ORDER — OXYCODONE HCL 5 MG PO TABS
5.0000 mg | ORAL_TABLET | ORAL | Status: DC | PRN
  Administered 2018-06-04 – 2018-06-05 (×2): 10 mg via ORAL
  Filled 2018-06-04 (×2): qty 2

## 2018-06-04 MED ORDER — OXYCODONE HCL 5 MG PO TABS
5.0000 mg | ORAL_TABLET | ORAL | Status: DC | PRN
  Administered 2018-06-04: 10 mg via ORAL
  Filled 2018-06-04: qty 2

## 2018-06-04 MED ORDER — SODIUM CHLORIDE 0.9 % IV SOLN
3.0000 g | Freq: Four times a day (QID) | INTRAVENOUS | Status: DC
  Administered 2018-06-04 – 2018-06-07 (×11): 3 g via INTRAVENOUS
  Filled 2018-06-04 (×13): qty 3

## 2018-06-04 MED ORDER — DILTIAZEM HCL 60 MG PO TABS: 90.0000 mg | ORAL_TABLET | Freq: Four times a day (QID) | ORAL | Status: DC

## 2018-06-04 MED ORDER — DILTIAZEM HCL 60 MG PO TABS
90.0000 mg | ORAL_TABLET | Freq: Four times a day (QID) | ORAL | Status: DC
  Administered 2018-06-05 – 2018-06-07 (×10): 90 mg via ORAL
  Filled 2018-06-04 (×2): qty 2
  Filled 2018-06-04: qty 1.5
  Filled 2018-06-04 (×7): qty 2

## 2018-06-04 NOTE — Progress Notes (Signed)
[facility]/Trauma Progress Note      Assessment/Plan A. Fib with RVR on Chronic Eliquis  CLL/SLL (Dx 2009 followed by Dr. 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 She would like to see care back in [place] where she is from and that is where she has her medical care for her CLL as well. Okay for heparin, hold Eliquis in the event acute surgical intervention is needed   LOS: 1 day    Subjective: CC: abdominal pain  Pain is slightly better since admission. She is not sleeping well cause of pain. She is only taking tylenol. No nausea or vomiting. She is afraid to take narcotics for fear of getting addicted.   Objective: Vital signs in last 24 hours: Temp:  [98.2 F (36.8 C)-99.6 F (37.6 C)] 98.9 F (37.2 C) (01/28 0832) Pulse Rate:  [90-117] 93 (01/28 0758) Resp:  [16-30] 21 (01/28 0758) BP: (91-114)/(56-74) 105/65 (01/28 0758) SpO2:  [88 %-97 %] 95 % (01/28 0836) Last BM Date: 06/03/18  Intake/Output from previous day: 01/27 0701 - 01/28 0700 In: 500 [I.V.:500] Out: -  Intake/Output this shift: No intake/output data recorded.  PE: Gen:  Alert, NAD, looks uncomfortable  Pulm:  Rate and effort normal Abd: Soft, obese, ND, +BS, lower abdominal TTP, no guarding Skin: no rashes noted, warm and dry   Anti-infectives: Anti-infectives (From admission, onward)   Start     Dose/Rate Route Frequency Ordered Stop   06/03/18 1000  piperacillin-tazobactam (ZOSYN) IVPB 3.375 g     3.375 g 12.5 mL/hr over 240 Minutes Intravenous Every 8 hours 06/03/18 0512     06/03/18 0115   piperacillin-tazobactam (ZOSYN) IVPB 3.375 g     3.375 g 100 mL/hr over 30 Minutes Intravenous  Once 06/03/18 0114 06/03/18 0222      Lab Results:  Recent Labs    06/03/18 1205 06/04/18 0341  WBC 46.2* 37.1*  HGB 10.8* 9.7*  HCT 34.4* 30.3*  PLT 142* 133*   BMET Recent Labs    06/03/18 0500 06/04/18 0341  NA 134* 134*  K 4.0 3.3*  CL 100 100  CO2 21* 22  GLUCOSE 152* 128*  BUN 17 15  CREATININE 1.11* 0.90  CALCIUM 7.9* 7.8*   PT/INR No results for input(s): LABPROT, INR in the last 72 hours. CMP     Component Value Date/Time   NA 134 (L) 06/04/2018 0341   K 3.3 (L) 06/04/2018 0341   CL 100 06/04/2018 0341   CO2 22 06/04/2018 0341   GLUCOSE 128 (H) 06/04/2018 0341   BUN 15 06/04/2018 0341   CREATININE 0.90 06/04/2018 0341   CALCIUM 7.8 (L) 06/04/2018 0341   PROT 5.0 (L) 06/04/2018 0341   ALBUMIN 2.7 (L) 06/04/2018 0341   AST 15 06/04/2018 0341   ALT 20 06/04/2018 0341   ALKPHOS 44 06/04/2018 0341   BILITOT 1.7 (H) 06/04/2018 0341   GFRNONAA >60 06/04/2018 0341   GFRAA >60 06/04/2018 0341   Lipase     Component Value Date/Time   LIPASE 21 06/02/2018 2149    Studies/Results: Dg Chest 2 View  Result Date: 06/03/2018 CLINICAL DATA:  Asthma. Body aches and fever. EXAM: CHEST - 2 VIEW COMPARISON:  None. FINDINGS: Mild cardiomegaly with normal mediastinal contours. Mild bronchitic change. No confluent airspace disease, pleural effusion or pneumothorax. No evidence of pulmonary edema. No acute osseous abnormalities. IMPRESSION: Cardiomegaly.  Mild bronchial thickening suggesting asthma. Electronically Signed   By: Keith Rake M.D.   On: 06/03/2018 00:35   Ct Abdomen Pelvis W Contrast  Result Date: 06/03/2018 CLINICAL DATA:  Abdominal pain with vomiting. EXAM: CT ABDOMEN AND PELVIS WITH CONTRAST TECHNIQUE: Multidetector CT imaging of the abdomen and pelvis was performed using the standard protocol following bolus administration of intravenous contrast.  CONTRAST:  134mL OMNIPAQUE IOHEXOL 300 MG/ML  SOLN COMPARISON:  None. FINDINGS: Lower chest: No acute abnormality.  Small hiatal hernia. Hepatobiliary: 8 mm too small to be actually characterize hypoattenuated nodule in the right lobe of the liver. Probable subcapsular 11 mm cyst in the left lobe of the liver. Normal gallbladder. Pancreas: Unremarkable. No pancreatic ductal dilatation or surrounding inflammatory changes. Spleen: Normal in size without focal abnormality. Adrenals/Urinary Tract: Normal adrenal glands. 11 mm hypoattenuated nodule, partially exophytic off of the inferior pole of the right kidney. No evidence of hydronephrosis or nephrolithiasis. Stomach/Bowel: Normal appearance of the stomach and small bowel. 3 cm masslike outpouching off of the serosal surface of the colon at the hepatic flexure. Short segment of masslike asymmetric mucosal thickening of the sigmoid colon measuring 6.7 x 5.9 cm. Foci of gas are noted within this area of thickening. There are extensive surrounding pericolonic inflammatory changes. This abnormality abuts the superior serosal surface of the urinary bladder which is also thickened. Vascular/Lymphatic: Numerous enlarged lymph nodes along the pelvic floor, along the iliac chains and retroperitoneum. There are also scattered rounded prominent lymph nodes in the mesentery of the sigmoid colon. Reproductive: Status post hysterectomy. No adnexal masses. Other: No abdominal wall hernia or abnormality. No abdominopelvic ascites. Musculoskeletal: No acute or significant osseous findings. IMPRESSION: Short segment of masslike asymmetric mucosal thickening of the sigmoid colon, containing foci of gas and extensive pericolonic inflammatory changes. This may potentially represent a diverticulitis with focal wall thickening and abscess formation, however the appearance is more suggestive of a necrotic primary colonic malignancy. This abnormality abuts the superior serosal surface of the  urinary bladder, which is potentially involved. Pathologic lymphadenopathy along the bilateral pelvic wall, iliac chains and retroperitoneum. Second 3 cm masslike outpouching off of the hepatic flexure of the colon, suspicious for malignancy. 8 mm too small to be actually characterize subtle hypoattenuated nodule in the right lobe of the liver. 11 mm indeterminate nodule off of the posterior cortex of the right kidney. Electronically Signed   By: Fidela Salisbury M.D.   On: 06/03/2018 00:42      Kalman Drape , [facility] Surgery 06/04/2018, 8:57 AM  Pager: [phone] Mon-Wed, Friday 7:00am-4:30pm Thurs 7am-11:30am  Consults: [phone]

## 2018-06-04 NOTE — Progress Notes (Signed)
[place] TEAM 1 - Stepdown/ICU TEAM  Beth Alvarez  LYY:[id number] DOB: 25-Aug-1950 DOA: 06/02/2018 PCP: System, Pcp Not In    Brief Narrative:  68 y.o. female with a hx of of asthma, atrial fibrillation, hypertension, CLL, and uterine cancer s/p TAH w/ XRT who is visiting from [place]. She had not been feeling well for 2 days with mild shortness of breath and abdominal discomfort with some nausea and chills.   In the ER she was found to be in in A. fib with RVR with significant leukocytosis. Abdomen was tender diffusely but w/ no rigidity or rebound tenderness. CT abdom/pelvis noted features concerning for sigmoid diverticulitis with abscess or colonic mass with possible metastasis.    Significant Events: 1/27 admit   Subjective: Pt reports ongoing abdom pain w/ associated nausea. She reports very little oral intake. She denies cp or sob.   Assessment & Plan:  Abdom pain - Acute diverticulitis - Clostridium bacteremia Cont abx tx but narrow coverage to blood culture results   2 potential colon masses/phlegmon 1 in sigmoid colon / 1 of hepatic flexure - for outpt colo back home in [place] once diverticulitis stable enough to allow - have discussed in great detail w/ pt, and informed her that we consider this to be cancer until otherwise proven - she is aware that time is of the essence in regard to having a bx done (via colo) to allow for further tx planning   Chronic Afib w/ acute RVR HR controlled on diltiazem gtt - attempt to transition to oral meds - cont apixaban for now   Hypokalemia  Supplement and follow   Relative Hypomagnesemia  Supplement and follow  Asthma  Quiescent presently   CLL Currently on observation - WBC declining w/ tx of above   Anemia  Hgb holding steady   HTN  Not an active issue presenlty    DVT prophylaxis: eliquis  Code Status: FULL CODE Family Communication: spoke w/ sister at bedside  Disposition Plan: SDU until off cardizem gtt    Consultants:  Gen Surgery   Antimicrobials:  Zosyn 1/26 > 1/28 Unasyn 1/28 >  Objective: Blood pressure 109/63, pulse (!) 104, temperature 98.9 F (37.2 C), temperature source Axillary, resp. rate 18, height 5\' 6"  (1.676 m), weight 129.3 kg, SpO2 91 %. No intake or output data in the 24 hours ending 06/04/18 1530 Filed Weights   06/03/18 0442  Weight: 129.3 kg    Examination: General: No acute respiratory distress Lungs: Clear to auscultation bilaterally without wheezes or crackles Cardiovascular: irreg irreg - rate ~100 - no M or rub  Abdomen: mildly tender diffusely - no mass - no rebound - BS+ - soft  Extremities: No significant cyanosis, clubbing, or edema bilateral lower extremities   CBC: Recent Labs  Lab 06/02/18 2149 06/03/18 0500 06/03/18 0901 06/03/18 1205 06/04/18 0341  WBC 45.0* 48.5* 45.8* 46.2* 37.1*  NEUTROABS  --  31.5*  --   --   --   HGB 11.1* 10.0* 10.0* 10.8* 9.7*  HCT 35.6* 32.6* 32.1* 34.4* 30.3*  MCV 92.7 95.9 93.3 95.0 94.7  PLT 169 156 155 142* 127*   Basic Metabolic Panel: Recent Labs  Lab 06/02/18 2149 06/03/18 0500 06/04/18 0341  NA 134* 134* 134*  K 3.3* 4.0 3.3*  CL 101 100 100  CO2 20* 21* 22  GLUCOSE 144* 152* 128*  BUN 16 17 15   CREATININE 1.01* 1.11* 0.90  CALCIUM 8.8* 7.9* 7.8*  MG  --   --  1.8   GFR: Estimated Creatinine Clearance: 85.9 mL/min (by C-G formula based on SCr of 0.9 mg/dL).  Liver Function Tests: Recent Labs  Lab 06/02/18 2149 06/03/18 0500 06/04/18 0341  AST 20 19 15   ALT 25 21 20   ALKPHOS 46 38 44  BILITOT 2.0* 2.3* 1.7*  PROT 5.9* 5.3* 5.0*  ALBUMIN 3.7 3.1* 2.7*   Recent Labs  Lab 06/02/18 2149  LIPASE 21    Cardiac Enzymes: Recent Labs  Lab 06/03/18 0500 06/03/18 0901  TROPONINI <0.03 <0.03    Scheduled Meds: . apixaban  5 mg Oral BID  . latanoprost  1 drop Both Eyes Daily  . levalbuterol  0.63 mg Nebulization Q6H  . metoprolol tartrate  5 mg Intravenous Q6H  .  mometasone-formoterol  2 puff Inhalation BID  . umeclidinium bromide  1 puff Inhalation Daily   Continuous Infusions: . ampicillin-sulbactam (UNASYN) IV    . diltiazem (CARDIZEM) infusion 15 mg/hr (06/04/18 0659)     LOS: 1 day   Cherene Altes, MD [facility] Office  [phone] Pager - Text Page per Shea Evans as per below:  On-Call/Text Page:      [email or web address]  If 7PM-7AM, please contact night-coverage [email or web address] 06/04/2018, 3:30 PM

## 2018-06-05 LAB — CULTURE, BLOOD (ROUTINE X 2)
Special Requests: ADEQUATE
Special Requests: ADEQUATE

## 2018-06-05 LAB — CBC
HCT: 26.7 % — ABNORMAL LOW (ref 36.0–46.0)
Hemoglobin: 8.4 g/dL — ABNORMAL LOW (ref 12.0–15.0)
MCH: 29.8 pg (ref 26.0–34.0)
MCHC: 31.5 g/dL (ref 30.0–36.0)
MCV: 94.7 fL (ref 80.0–100.0)
PLATELETS: 160 10*3/uL (ref 150–400)
RBC: 2.82 MIL/uL — ABNORMAL LOW (ref 3.87–5.11)
RDW: 14.6 % (ref 11.5–15.5)
WBC: 33.1 10*3/uL — ABNORMAL HIGH (ref 4.0–10.5)
nRBC: 0 % (ref 0.0–0.2)

## 2018-06-05 LAB — COMPREHENSIVE METABOLIC PANEL
ALT: 19 U/L (ref 0–44)
AST: 15 U/L (ref 15–41)
Albumin: 2.6 g/dL — ABNORMAL LOW (ref 3.5–5.0)
Alkaline Phosphatase: 53 U/L (ref 38–126)
Anion gap: 8 (ref 5–15)
BUN: 9 mg/dL (ref 8–23)
CALCIUM: 7.8 mg/dL — AB (ref 8.9–10.3)
CO2: 25 mmol/L (ref 22–32)
Chloride: 101 mmol/L (ref 98–111)
Creatinine, Ser: 0.73 mg/dL (ref 0.44–1.00)
GFR calc Af Amer: >60 mL/min (ref >60)
Glucose, Bld: 118 mg/dL — ABNORMAL HIGH (ref 70–99)
Potassium: 3.9 mmol/L (ref 3.5–5.1)
Sodium: 134 mmol/L — ABNORMAL LOW (ref 135–145)
Total Bilirubin: 0.8 mg/dL (ref 0.3–1.2)
Total Protein: 5 g/dL — ABNORMAL LOW (ref 6.5–8.1)

## 2018-06-05 LAB — MAGNESIUM: Magnesium: 2.3 mg/dL (ref 1.7–2.4)

## 2018-06-05 MED ORDER — BOOST / RESOURCE BREEZE PO LIQD CUSTOM
1.0000 | Freq: Three times a day (TID) | ORAL | Status: DC
  Administered 2018-06-05 (×2): 1 via ORAL

## 2018-06-05 MED ORDER — POTASSIUM CHLORIDE 10 MEQ/100ML IV SOLN
INTRAVENOUS | Status: AC
Start: 2018-06-05 — End: 2018-06-05
  Administered 2018-06-05: 10 meq
  Filled 2018-06-05: qty 100

## 2018-06-05 MED ORDER — HEPARIN (PORCINE) 25000 UT/250ML-% IV SOLN
1500.0000 [IU]/h | INTRAVENOUS | Status: DC
  Administered 2018-06-05: 1250 [IU]/h via INTRAVENOUS
  Filled 2018-06-05: qty 250

## 2018-06-05 NOTE — Progress Notes (Signed)
[place] TEAM 1 - Stepdown/ICU TEAM  Preston Weill  OEV:[id number] DOB: 1951/01/13 DOA: 06/02/2018 PCP: System, Pcp Not In    Brief Narrative:  68 y.o. female with a hx of of asthma, atrial fibrillation, hypertension, CLL, and uterine cancer s/p TAH w/ XRT who is visiting from [place]. She had not been feeling well for 2 days with mild shortness of breath and abdominal discomfort with some nausea and chills.   In the ER she was found to be in in A. fib with RVR with significant leukocytosis. Abdomen was tender diffusely but w/ no rigidity or rebound tenderness. CT abdom/pelvis noted features concerning for sigmoid diverticulitis with abscess or colonic mass with possible metastasis.    Significant Events: 1/27 admit   Subjective: Pt says that she is still having some abdominal pain but improving. Denies cp or sob.   Assessment & Plan:  Abdom pain - Acute diverticulitis - Clostridium bacteremia -Continue Unasyn. Will order repeat blood cx to document clearance  potential colon masses/phlegmon 1 in sigmoid colon / 1 of hepatic flexure - for outpt colonoscopy back home in [place] once diverticulitis stable enough to allow - discussed in great detail w/ pt. -At this time she is anxious to get back to [facility] to follow-up with her own physicians and to get colonoscopy and biopsy there. -Surgery following.  Note today reviewed.  Will hold Eliquis and switch to IV heparin.  Discussed about this with the patient.  She was not too happy about holding the Eliquis and switching to IV heparin.  However, discussed the risk versus benefits with her and she agreed to make the switch.  Chronic Afib w/ acute RVR Continue oral Cardizem.  Hold Eliquis, switched to IV heparin.  Hypokalemia  Replaced   Relative Hypomagnesemia  Replaced  Asthma  Quiescent presently   CLL Currently on observation - WBC declining w/ tx of above   Anemia  Hgb today 8.4.  Continue to monitor.  HTN    -Continue current medication.  Continue to monitor blood pressure closely and adjust medications.  DVT prophylaxis: hold eliquis, switch to Heparin Code Status: FULL CODE Family Communication: No family at bedside  Disposition Plan: Home when stable  Consultants:  Gen Surgery   Antimicrobials:  Zosyn 1/26 > 1/28 Unasyn 1/28 >  Objective: Blood pressure (!) 122/53, pulse 90, temperature 97.9 F (36.6 C), temperature source Oral, resp. rate 17, height 5\' 6"  (1.676 m), weight 129.3 kg, SpO2 100 %.  Intake/Output Summary (Last 24 hours) at 06/05/2018 1521 Last data filed at 06/05/2018 1504 Gross per 24 hour  Intake 1040.36 ml  Output -  Net 1040.36 ml   Filed Weights   06/03/18 0442  Weight: 129.3 kg    Examination: General: No acute respiratory distress Lungs: Clear to auscultation bilaterally without wheezes or crackles Cardiovascular: irreg irreg,  - no M or rub  Abdomen: soft, non specific tenderness without any guarding or rebound, BS+  Extremities: No significant cyanosis, clubbing, or edema bilateral lower extremities   CBC: Recent Labs  Lab 06/03/18 0500 06/03/18 0901 06/03/18 1205 06/04/18 0341 06/05/18 0339  WBC 48.5* 45.8* 46.2* 37.1* 33.1*  NEUTROABS 31.5*  --   --   --   --   HGB 10.0* 10.0* 10.8* 9.7* 8.4*  HCT 32.6* 32.1* 34.4* 30.3* 26.7*  MCV 95.9 93.3 95.0 94.7 94.7  PLT 156 155 142* 133* 829   Basic Metabolic Panel: Recent Labs  Lab 06/02/18 2149 06/03/18 0500 06/04/18 0341 06/05/18 9371  NA 134* 134* 134* 134*  K 3.3* 4.0 3.3* 3.9  CL 101 100 100 101  CO2 20* 21* 22 25  GLUCOSE 144* 152* 128* 118*  BUN 16 17 15 9   CREATININE 1.01* 1.11* 0.90 0.73  CALCIUM 8.8* 7.9* 7.8* 7.8*  MG  --   --  1.8 2.3   GFR: Estimated Creatinine Clearance: 96.6 mL/min (by C-G formula based on SCr of 0.73 mg/dL).  Liver Function Tests: Recent Labs  Lab 06/02/18 2149 06/03/18 0500 06/04/18 0341 06/05/18 0339  AST 20 19 15 15   ALT 25 21 20 19    ALKPHOS 46 38 44 53  BILITOT 2.0* 2.3* 1.7* 0.8  PROT 5.9* 5.3* 5.0* 5.0*  ALBUMIN 3.7 3.1* 2.7* 2.6*   Recent Labs  Lab 06/02/18 2149  LIPASE 21    Cardiac Enzymes: Recent Labs  Lab 06/03/18 0500 06/03/18 0901  TROPONINI <0.03 <0.03    Scheduled Meds: . apixaban  5 mg Oral BID  . diltiazem  90 mg Oral Q6H  . feeding supplement  1 Container Oral TID BM  . latanoprost  1 drop Both Eyes Daily  . metoprolol tartrate  5 mg Intravenous Q6H  . mometasone-formoterol  2 puff Inhalation BID  . umeclidinium bromide  1 puff Inhalation Daily   Continuous Infusions: . ampicillin-sulbactam (UNASYN) IV 3 g (06/05/18 1432)     LOS: 2 days   Yaakov Guthrie, MD [facility] Office  [phone] Pager - Text Page per Shea Evans as per below:  On-Call/Text Page:      [email or web address]  If 7PM-7AM, please contact night-coverage [email or web address] 06/05/2018, 3:21 PM

## 2018-06-05 NOTE — Progress Notes (Signed)
ANTICOAGULATION CONSULT NOTE - Initial Consult  Pharmacy Consult for heparin Indication: atrial fibrillation  No Known Allergies  Patient Measurements: Height: 5\' 6"  (167.6 cm) Weight: 285 lb (129.3 kg) IBW/kg (Calculated) : 59.3 Heparin Dosing Weight: 90.7 kg  Vital Signs: Temp: 97.9 F (36.6 C) (01/29 0748) Temp Source: Oral (01/29 0748) BP: 122/53 (01/29 1241) Pulse Rate: 90 (01/29 0824)  Labs: Recent Labs    06/03/18 0500 06/03/18 0901 06/03/18 1205 06/04/18 0341 06/05/18 0339  HGB 10.0* 10.0* 10.8* 9.7* 8.4*  HCT 32.6* 32.1* 34.4* 30.3* 26.7*  PLT 156 155 142* 133* 160  CREATININE 1.11*  --   --  0.90 0.73  TROPONINI <0.03 <0.03  --   --   --     Estimated Creatinine Clearance: 96.6 mL/min (by C-G formula based on SCr of 0.73 mg/dL).   Medical History: Past Medical History:  Diagnosis Date  . Atrial fibrillation ([place])   . CLL (chronic lymphocytic leukemia) ([place])   . Hypertension    Assessment: 68 yo female with h/o atrial fibrillation on apixaban. Patient being treated for diverticulitis. Apixaban to be held for possible acute surgical procedure. Last dose of apixaban given at ~1000 this AM. No bleeding currently noted.   Due to apixaban, will initiate heparin 12 hours from last dose of apixaban and will use APTT initially to dose heparin due to effect of apixaban on heparin levels.   Goal of Therapy:  APTT 66-102 Heparin level 0.3-0.7 units/ml Monitor platelets by anticoagulation protocol: Yes   Plan:  Heparin drip at 1250 units/hr, no bolus APTT and heparin level in 6 hours Daily heparin level, aPTT and CBC Monitor for bleeding  Miyoko Hashimi A Raoul Ciano 06/05/2018,4:05 PM

## 2018-06-05 NOTE — Progress Notes (Signed)
[facility]/Trauma Progress Note      Assessment/Plan A. Fib with RVR on Chronic Eliquis CLL/SLL(Dx 2009 followed by Dr. Gaylyn Cheers at [facility] in [place], stage 0 - no current tx) Endometrial adenocarcinoma(2017) s/pTAH/BSO and radiation Hypertension Asthma  Diverticulitis vs Malignancy  -Will tx as diverticulitis -Will need colonoscopy in 6-8 weeks  -No acute surgical interventions at this time  FEN -CLD, IVF VTE -SCD, okay for heparin from a surgical standpoint, hold Eliquis ID -Zosyn >>   WBC 33.1  Plan: Okay to advance diet once pain resolves. She would like to see care back in [place] where she is from and that is where she has her medical care for her CLL as well. Okay for heparin, hold Eliquis in the event acute surgical intervention is needed   LOS: 2 days    Subjective: CC: abdominal pain  Pt is feeling stronger today and less weak. She overall feels better. She would like to ambulate. She states her WBC trends between 35K and 50K due to her CLL and has so for the last 10 years or so. Mild nausea overnight but no vomiting. Tolerating CLD. No BM or flatus since yesterday morning.  Objective: Vital signs in last 24 hours: Temp:  [97.7 F (36.5 C)-98 F (36.7 C)] 97.9 F (36.6 C) (01/29 0748) Pulse Rate:  [90-105] 90 (01/29 0824) Resp:  [17-30] 17 (01/29 0824) BP: (98-124)/(62-67) 124/67 (01/29 0020) SpO2:  [91 %-100 %] 100 % (01/29 0824) Last BM Date: 06/04/18  Intake/Output from previous day: 01/28 0701 - 01/29 0700 In: 200 [IV Piggyback:200] Out: -  Intake/Output this shift: No intake/output data recorded.  PE: Gen:  Alert, NAD, looks more comfortable today Pulm:  Rate and effort normal Abd: Soft, obese, ND, +BS, mild lower abdominal TTP, no guarding Skin: no rashes noted, warm and dry   Anti-infectives: Anti-infectives (From admission, onward)   Start     Dose/Rate Route Frequency Ordered Stop   06/04/18 1530   Ampicillin-Sulbactam (UNASYN) 3 g in sodium chloride 0.9 % 100 mL IVPB     3 g 200 mL/hr over 30 Minutes Intravenous Every 6 hours 06/04/18 1448     06/03/18 1000  piperacillin-tazobactam (ZOSYN) IVPB 3.375 g  Status:  Discontinued     3.375 g 12.5 mL/hr over 240 Minutes Intravenous Every 8 hours 06/03/18 0512 06/04/18 1448   06/03/18 0115  piperacillin-tazobactam (ZOSYN) IVPB 3.375 g     3.375 g 100 mL/hr over 30 Minutes Intravenous  Once 06/03/18 0114 06/03/18 0222      Lab Results:  Recent Labs    06/04/18 0341 06/05/18 0339  WBC 37.1* 33.1*  HGB 9.7* 8.4*  HCT 30.3* 26.7*  PLT 133* 160   BMET Recent Labs    06/04/18 0341 06/05/18 0339  NA 134* 134*  K 3.3* 3.9  CL 100 101  CO2 22 25  GLUCOSE 128* 118*  BUN 15 9  CREATININE 0.90 0.73  CALCIUM 7.8* 7.8*   PT/INR No results for input(s): LABPROT, INR in the last 72 hours. CMP     Component Value Date/Time   NA 134 (L) 06/05/2018 0339   K 3.9 06/05/2018 0339   CL 101 06/05/2018 0339   CO2 25 06/05/2018 0339   GLUCOSE 118 (H) 06/05/2018 0339   BUN 9 06/05/2018 0339   CREATININE 0.73 06/05/2018 0339   CALCIUM 7.8 (L) 06/05/2018 0339   PROT 5.0 (L) 06/05/2018 0339   ALBUMIN 2.6 (L) 06/05/2018 0339   AST 15  06/05/2018 0339   ALT 19 06/05/2018 0339   ALKPHOS 53 06/05/2018 0339   BILITOT 0.8 06/05/2018 0339   GFRNONAA >60 06/05/2018 0339   GFRAA >60 06/05/2018 0339   Lipase     Component Value Date/Time   LIPASE 21 06/02/2018 2149    Studies/Results: No results found.    Kalman Drape , [facility] Surgery 06/05/2018, 9:12 AM  Pager: [phone] Mon-Wed, Friday 7:00am-4:30pm Thurs 7am-11:30am  Consults: [phone]

## 2018-06-06 DIAGNOSIS — R7881 Bacteremia: Secondary | ICD-10-CM

## 2018-06-06 DIAGNOSIS — I10 Essential (primary) hypertension: Secondary | ICD-10-CM

## 2018-06-06 LAB — BASIC METABOLIC PANEL
Anion gap: 7 (ref 5–15)
BUN: 6 mg/dL — AB (ref 8–23)
CO2: 26 mmol/L (ref 22–32)
CREATININE: 0.79 mg/dL (ref 0.44–1.00)
Calcium: 8.1 mg/dL — ABNORMAL LOW (ref 8.9–10.3)
Chloride: 106 mmol/L (ref 98–111)
GFR calc Af Amer: >60 mL/min (ref >60)
GFR calc non Af Amer: >60 mL/min (ref >60)
Glucose, Bld: 110 mg/dL — ABNORMAL HIGH (ref 70–99)
Potassium: 3.3 mmol/L — ABNORMAL LOW (ref 3.5–5.1)
Sodium: 139 mmol/L (ref 135–145)

## 2018-06-06 LAB — CBC
HCT: 27.6 % — ABNORMAL LOW (ref 36.0–46.0)
Hemoglobin: 8.6 g/dL — ABNORMAL LOW (ref 12.0–15.0)
MCH: 29.2 pg (ref 26.0–34.0)
MCHC: 31.2 g/dL (ref 30.0–36.0)
MCV: 93.6 fL (ref 80.0–100.0)
Platelets: 168 10*3/uL (ref 150–400)
RBC: 2.95 MIL/uL — ABNORMAL LOW (ref 3.87–5.11)
RDW: 14.6 % (ref 11.5–15.5)
WBC: 26.5 10*3/uL — ABNORMAL HIGH (ref 4.0–10.5)
nRBC: 0.1 % (ref 0.0–0.2)

## 2018-06-06 LAB — HEPARIN LEVEL (UNFRACTIONATED): Heparin Unfractionated: 1.74 IU/mL — ABNORMAL HIGH (ref 0.30–0.70)

## 2018-06-06 LAB — APTT
aPTT: 46 seconds — ABNORMAL HIGH (ref 24–36)
aPTT: 42 seconds — ABNORMAL HIGH (ref 24–36)

## 2018-06-06 MED ORDER — APIXABAN 5 MG PO TABS
5.0000 mg | ORAL_TABLET | Freq: Two times a day (BID) | ORAL | Status: DC
  Administered 2018-06-06 – 2018-06-07 (×3): 5 mg via ORAL
  Filled 2018-06-06 (×3): qty 1

## 2018-06-06 NOTE — Progress Notes (Signed)
ANTICOAGULATION CONSULT NOTE - Follow Up Consult  Pharmacy Consult for heparin Indication: atrial fibrillation  Labs: Recent Labs    06/03/18 0901  06/04/18 0341 06/05/18 0339 06/06/18 0429  HGB 10.0*   < > 9.7* 8.4* 8.6*  HCT 32.1*   < > 30.3* 26.7* 27.6*  PLT 155   < > 133* 160 168  APTT  --   --   --   --  46*  HEPARINUNFRC  --   --   --   --  1.74*  CREATININE  --   --  0.90 0.73 0.79  TROPONINI <0.03  --   --   --   --    < > = values in this interval not displayed.    Assessment: 68yo female subtherapeutic on heparin with initial dosing while Eliquis on hold; no gtt issues or signs of bleeding per RN.  Goal of Therapy:  aPTT 66-102 seconds   Plan:  Will increase heparin gtt by 3 units/kgABW/hr to 1500 units/hr and check PTT in 6 hours.    Wynona Neat, PharmD, BCPS  06/06/2018,6:20 AM

## 2018-06-06 NOTE — Care Management Important Message (Signed)
Important Message  Patient Details  Name: Beth Alvarez MRN: [id number] Date of Birth: Jan 13, 1951   Medicare Important Message Given:  Yes    Geralyn Figiel 06/06/2018, 4:03 PM

## 2018-06-06 NOTE — Progress Notes (Signed)
ANTICOAGULATION CONSULT NOTE - Initial Consult  Pharmacy Consult for heparin>>apixaban Indication: atrial fibrillation  No Known Allergies  Patient Measurements: Height: 5\' 6"  (167.6 cm) Weight: 285 lb (129.3 kg) IBW/kg (Calculated) : 59.3 Heparin Dosing Weight: 90.7 kg  Vital Signs: Temp: 97.9 F (36.6 C) (01/30 1224) Temp Source: Oral (01/30 1224) Pulse Rate: 96 (01/30 0900)  Labs: Recent Labs    06/04/18 0341 06/05/18 0339 06/06/18 0429 06/06/18 1106  HGB 9.7* 8.4* 8.6*  --   HCT 30.3* 26.7* 27.6*  --   PLT 133* 160 168  --   APTT  --   --  46* 42*  HEPARINUNFRC  --   --  1.74*  --   CREATININE 0.90 0.73 0.79  --     Estimated Creatinine Clearance: 96.6 mL/min (by C-G formula based on SCr of 0.79 mg/dL).   Medical History: Past Medical History:  Diagnosis Date  . Atrial fibrillation ([place])   . CLL (chronic lymphocytic leukemia) ([place])   . Hypertension    Assessment: 68 yo female with h/o atrial fibrillation on apixaban. Patient being treated for diverticulitis. Apixaban to be held for possible acute surgical procedure. Last dose of apixaban given at ~1000 this AM. No bleeding currently noted.   Since pt will not have any intervention here, we will transition her back to PO apixaban after the discussion with Dr. Dyanne Iha.   Age<80, wt>60kg, scr<1.5  Plan:   Dc heparin  Apixaban 5mg  PO BID  Onnie Boer, PharmD, [place], AAHIVP, CPP Infectious Disease Pharmacist 06/06/2018 12:46 PM

## 2018-06-06 NOTE — Discharge Instructions (Signed)
Information on my medicine - ELIQUIS (apixaban)  Why was Eliquis prescribed for you? Eliquis was prescribed for you to reduce the risk of forming blood clots that can cause a stroke if you have a medical condition called atrial fibrillation (a type of irregular heartbeat) OR to reduce the risk of a blood clots forming after orthopedic surgery.  What do You need to know about Eliquis ? Take your Eliquis TWICE DAILY - one tablet in the morning and one tablet in the evening with or without food.  It would be best to take the doses about the same time each day.  If you have difficulty swallowing the tablet whole please discuss with your pharmacist how to take the medication safely.  Take Eliquis exactly as prescribed by your doctor and DO NOT stop taking Eliquis without talking to the doctor who prescribed the medication.  Stopping may increase your risk of developing a new clot or stroke.  Refill your prescription before you run out.  After discharge, you should have regular check-up appointments with your healthcare provider that is prescribing your Eliquis.  In the future your dose may need to be changed if your kidney function or weight changes by a significant amount or as you get older.  What do you do if you miss a dose? If you miss a dose, take it as soon as you remember on the same day and resume taking twice daily.  Do not take more than one dose of ELIQUIS at the same time.  Important Safety Information A possible side effect of Eliquis is bleeding. You should call your healthcare provider right away if you experience any of the following: ? Bleeding from an injury or your nose that does not stop. ? Unusual colored urine (red or dark brown) or unusual colored stools (red or black). ? Unusual bruising for unknown reasons. ? A serious fall or if you hit your head (even if there is no bleeding).  Some medicines may interact with Eliquis and might increase your risk of bleeding  or clotting while on Eliquis. To help avoid this, consult your healthcare provider or pharmacist prior to using any new prescription or non-prescription medications, including herbals, vitamins, non-steroidal anti-inflammatory drugs (NSAIDs) and supplements.  This website has more information on Eliquis (apixaban): [email or web address].    Low-Fiber Eating Plan Fiber is found in fruits, vegetables, whole grains, and beans. Eating a diet low in fiber helps to reduce how often you have bowel movements and how much you produce during a bowel movement. A low-fiber eating plan may help your digestive system heal if:  You have certain conditions, such as Crohn's disease or diverticulitis.  You recently had radiation therapy on your pelvis or bowel.  You recently had intestinal surgery.  You have a new surgical opening in your abdomen (colostomy or ileostomy).  Your intestine is narrowed (stricture). Your health care provider will determine how long you need to stay on this diet. Your health care provider may recommend that you work with a diet and nutrition specialist (dietitian). What are tips for following this plan? General guidelines  Follow recommendations from your dietitian about how much fiber you should have each day.  Most people on this eating plan should try to eat less than 10 grams (g) of fiber each day. Your daily fiber goal is _________________ g.  Take vitamin and mineral supplements as told by your health care provider or dietitian. Chewable or liquid forms are best when on this eating plan.  Reading food labels  Check food labels for the amount of dietary fiber.  Choose foods that have less than 2 grams of fiber in one serving. Cooking  Use white flour and other allowed grains for baking and cooking.  Cook meat using methods that keep it tender, such as braising or poaching.  Cook eggs until the yolk is completely solid.  Cook with healthy oils, such as olive oil or  canola oil. Meal planning   Eat 5-6 small meals throughout the day instead of 3 large meals.  If you are lactose intolerant: ? Choose low-lactose dairy foods. ? Do not eat dairy foods, if told by your dietitian.  Limit fat and oils to less than 8 teaspoons a day.  Eat small portions of desserts. What foods are allowed? The items listed below may not be a complete list. Talk with your dietitian about what dietary choices are best for you. Grains All bread and crackers made with white flour. Waffles, pancakes, and [place] toast. Bagels. Pretzels. Melba toast, zwieback, and matzoh. Cooked and dried cereals that do not contain whole grains, added fiber, seeds, or dried fruit. CornmealDomenick Gong. Hot and cold cereals made with refined corn, wheat, rice, or oats. Plain pasta and noodles. White rice. Vegetables Well-cooked or canned vegetables without skin, seeds, or stems. Cooked potatoes without skins. Vegetable juice. Fruits Soft-cooked or canned fruits without skin and seeds. Peeled ripe banana. Applesauce. Fruit juice without pulp. Meats and other protein foods Ground meat. Tender cuts of meat or poultry. Eggs. Fish, seafood, and shellfish. Smooth nut butters. Tofu. Dairy All milk products and drinks. Lactose-free milks, including rice, soy, and almond milks. Yogurt without fruit, nuts, chocolate, or granola mix-ins. Sour cream. Cottage cheese. Cheese. Beverages Decaf coffee. Fruit and vegetable juices or smoothies (in small amounts, with no pulp or skins, and with fruits from allowed list). Sports drinks. Herbal tea. Fats and oils Olive oil, canola oil, sunflower oil, flaxseed oil, and grapeseed oil. Mayonnaise. Cream cheese. Margarine. Butter. Sweets and desserts Plain cakes and cookies. Cream pies and pies made with allowed fruits. Pudding. Custard. Fruit gelatin. Sherbet. Popsicles. Ice cream without nuts. Plain hard candy. Honey. Jelly. Molasses. Syrups, including chocolate syrup.  Chocolate. Marshmallows. Gumdrops. Seasoning and other foods Bouillon. Broth. Cream soups made from allowed foods. Strained soup. Casseroles made with allowed foods. Ketchup. Mild mustard. Mild salad dressings. Plain gravies. Vinegar. Spices in moderation. Salt. Sugar. What foods are not allowed? The items listed below may not be a complete list. Talk with your dietitian about what dietary choices are best for you. Grains Whole wheat and whole grain breads and crackers. Multigrain breads and crackers. Rye bread. Whole grain or multigrain cereals. Cereals with nuts, raisins, or coconut. Bran. Coarse wheat cereals. Granola. High-fiber cereals. Cornmeal or corn bread. Whole grain pasta. Wild or brown rice. Quinoa. Popcorn. Buckwheat. Wheat germ. Vegetables Potato skins. Raw or undercooked vegetables. All beans and bean sprouts. Cooked greens. Corn. Peas. Cabbage. Beets. Broccoli. Brussels sprouts. Cauliflower. Mushrooms. Onions. Peppers. Parsnips. Okra. Sauerkraut. Fruit Raw or dried fruit. Berries. Fruit juice with pulp. Prune juice. Meats and other protein foods Tough, fibrous meats with gristle. Fatty meat. Poultry with skin. Fried meat, Sales executive, or fish. Deli or lunch meats. Sausage, bacon, and hot dogs. Nuts and chunky nut butter. Dried peas, beans, and lentils. Dairy Yogurt with fruit, nuts, chocolate, or granola mix-ins. Beverages Caffeinated coffee and teas. Fats and oils Avocado. Coconut. Sweets and desserts Desserts, cookies, or candies that contain nuts or  coconut. Dried fruit. Jams and preserves with seeds. Marmalade. Any dessert made with fruits or grains that are not allowed. Seasoning and other foods Corn tortilla chips. Soups made with vegetables or grains that are not allowed. Relish. Horseradish. Angie Fava. Olives. Summary  Most people on a low-fiber eating plan should eat less than 10 grams of fiber a day. Follow recommendations from your dietitian about how much fiber you  should have each day.  Always check food labels to see the dietary fiber content of packaged foods. In general, a low-fiber food will have fewer than 2 grams of fiber per serving.  In general, try to avoid whole grains, raw fruits and vegetables, dried fruit, tough cuts of meat, nuts, and seeds.  Take a vitamin and mineral supplement as told by your health care provider or dietitian. This information is not intended to replace advice given to you by your health care provider. Make sure you discuss any questions you have with your health care provider. Document Released: 10/14/2001 Document Revised: 06/27/2016 Document Reviewed: 06/27/2016 Elsevier Interactive Patient Education  2019 [employer].

## 2018-06-06 NOTE — Progress Notes (Signed)
[facility]/Trauma Progress Note      Assessment/Plan Diverticulitis vs Malignancy  - Will tx as diverticulitis, improving - Will need colonoscopy in 6-8 weeks  - No acute surgical interventions at this time - blood cultures showed clostridium perfringens, blood cultures 01/29 pending - continue IV abx until discharge  FEN -FLD, IVF VTE -SCD, Eliquis ID -Zosyn 01/27-01/28; UNasyn 01/28>>  WBC down to 26.5 but not concerning 2/2 hx of CLL, afebrile, blood cultures pending 01/29  Plan:leave on FLD and she understands she will need to be on a low fiber diet for 2 weeks after discharge. She would like to see care back in [place] where she is from and that is where she has her medical care for her CLL as well.Pt is insisting she wants to leave tomorrow.    LOS: 3 days    Subjective: CC: abdominal pain  Pain is improved. She is only taking tylenol. She is having flatus. No real BM. She wants to leave tomorrow. She wants more to eat. She does not want any type of surgery here. She is insistent on leaving tomorrow. I discussed 2 weeks of abx, low fiber diet, and colonoscopy in 6-8 weeks. Husband at bedside and they expressed understanding. I told her we do not plan on any surgical intervention unless she acutely worsens or shows signs of obstruction. I discussed plan and pt's wishes with Dr. Earnest Conroy.    Objective: Vital signs in last 24 hours: Temp:  [97.6 F (36.4 C)-98.4 F (36.9 C)] 97.6 F (36.4 C) (01/30 0357) Pulse Rate:  [88-96] 96 (01/30 0900) Resp:  [18] 18 (01/29 2045) BP: (122)/(53) 122/53 (01/29 1241) SpO2:  [93 %-96 %] 93 % (01/30 0900) Last BM Date: 06/04/18  Intake/Output from previous day: 01/29 0701 - 01/30 0700 In: 840.4 [I.V.:561.6; IV Piggyback:278.8] Out: -  Intake/Output this shift: No intake/output data recorded.  PE: Gen: Alert, NAD Pulm:Rate andeffort normal Abd: Soft,obese,ND, +BS, very mild lower abdominal TTP, no  guarding Skin: no rashes noted, warm and dry   Anti-infectives: Anti-infectives (From admission, onward)   Start     Dose/Rate Route Frequency Ordered Stop   06/04/18 1530  Ampicillin-Sulbactam (UNASYN) 3 g in sodium chloride 0.9 % 100 mL IVPB     3 g 200 mL/hr over 30 Minutes Intravenous Every 6 hours 06/04/18 1448     06/03/18 1000  piperacillin-tazobactam (ZOSYN) IVPB 3.375 g  Status:  Discontinued     3.375 g 12.5 mL/hr over 240 Minutes Intravenous Every 8 hours 06/03/18 0512 06/04/18 1448   06/03/18 0115  piperacillin-tazobactam (ZOSYN) IVPB 3.375 g     3.375 g 100 mL/hr over 30 Minutes Intravenous  Once 06/03/18 0114 06/03/18 0222      Lab Results:  Recent Labs    06/05/18 0339 06/06/18 0429  WBC 33.1* 26.5*  HGB 8.4* 8.6*  HCT 26.7* 27.6*  PLT 160 168   BMET Recent Labs    06/05/18 0339 06/06/18 0429  NA 134* 139  K 3.9 3.3*  CL 101 106  CO2 25 26  GLUCOSE 118* 110*  BUN 9 6*  CREATININE 0.73 0.79  CALCIUM 7.8* 8.1*   PT/INR No results for input(s): LABPROT, INR in the last 72 hours. CMP     Component Value Date/Time   NA 139 06/06/2018 0429   K 3.3 (L) 06/06/2018 0429   CL 106 06/06/2018 0429   CO2 26 06/06/2018 0429   GLUCOSE 110 (H) 06/06/2018 0429   BUN 6 (  L) 06/06/2018 0429   CREATININE 0.79 06/06/2018 0429   CALCIUM 8.1 (L) 06/06/2018 0429   PROT 5.0 (L) 06/05/2018 0339   ALBUMIN 2.6 (L) 06/05/2018 0339   AST 15 06/05/2018 0339   ALT 19 06/05/2018 0339   ALKPHOS 53 06/05/2018 0339   BILITOT 0.8 06/05/2018 0339   GFRNONAA >60 06/06/2018 0429   GFRAA >60 06/06/2018 0429   Lipase     Component Value Date/Time   LIPASE 21 06/02/2018 2149    Studies/Results: No results found.    Kalman Drape , [facility] Surgery 06/06/2018, 9:14 AM  Pager: [phone] Mon-Wed, Friday 7:00am-4:30pm Thurs 7am-11:30am  Consults: [phone]

## 2018-06-06 NOTE — Progress Notes (Addendum)
PROGRESS NOTE    Beth Alvarez  GMW:[id number]  DOB: Jun 27, 1950  DOA: 06/02/2018 PCP: System, Pcp Not In  Brief Narrative:  68 y.o.femalewitha hx of of asthma, atrial fibrillation, hypertension, CLL, and uterine cancer s/p TAH w/ XRT who is visiting from [place] presented to the ED with complaints of nausea abdominal pain and chills. In the ER she was found to be in in A. fib with RVR with significant leukocytosis. Abdomen was tender diffusely but w/ no rigidity or rebound tenderness. CT abdom/pelvis noted features concerning for sigmoid diverticulitis with abscess or colonic mass with possible metastasis.  Her baseline white count is around 30,000 and was elevated to 40 5K, treated with IV antibiotics during the hospital course and seen by general surgery.  Blood cultures were positive for Clostridium. Patient states her CLL has been in remission and does not want any interventions including colonoscopy while here.  She wishes to return to [place] and follow-up her primary oncology/GI there.   Subjective:  Patient denies any abdominal pain.  Tolerating clear liquid diet.  Seen by general surgery this morning and she declined any surgical interventions.  She has been afebrile.  Objective: Vitals:   06/06/18 0357 06/06/18 0900 06/06/18 1224 06/06/18 1542  BP:    116/70  Pulse:  96  95  Resp:    18  Temp: 97.6 F (36.4 C)  97.9 F (36.6 C) 98.1 F (36.7 C)  TempSrc: Oral  Oral Oral  SpO2:  93%  98%  Weight:      Height:       No intake or output data in the 24 hours ending 06/06/18 1617 Filed Weights   06/03/18 0442  Weight: 129.3 kg    Physical Examination:  General exam: Appears calm and comfortable  Respiratory system: Clear to auscultation. Respiratory effort normal. Cardiovascular system: S1 & S2 heard, RRR. No JVD, murmurs, rubs, gallops or clicks. No pedal edema. Gastrointestinal system: Abdomen is nondistended, soft and nontender. No organomegaly or masses  felt. Normal bowel sounds heard. Central nervous system: Alert and oriented. No focal neurological deficits. Extremities: Symmetric 5 x 5 power. Skin: No rashes, lesions or ulcers Psychiatry: Judgement and insight appear normal. Mood & affect appropriate.     Data Reviewed: I have personally reviewed following labs and imaging studies  CBC: Recent Labs  Lab 06/03/18 0500 06/03/18 0901 06/03/18 1205 06/04/18 0341 06/05/18 0339 06/06/18 0429  WBC 48.5* 45.8* 46.2* 37.1* 33.1* 26.5*  NEUTROABS 31.5*  --   --   --   --   --   HGB 10.0* 10.0* 10.8* 9.7* 8.4* 8.6*  HCT 32.6* 32.1* 34.4* 30.3* 26.7* 27.6*  MCV 95.9 93.3 95.0 94.7 94.7 93.6  PLT 156 155 142* 133* 160 440   Basic Metabolic Panel: Recent Labs  Lab 06/02/18 2149 06/03/18 0500 06/04/18 0341 06/05/18 0339 06/06/18 0429  NA 134* 134* 134* 134* 139  K 3.3* 4.0 3.3* 3.9 3.3*  CL 101 100 100 101 106  CO2 20* 21* 22 25 26   GLUCOSE 144* 152* 128* 118* 110*  BUN 16 17 15 9  6*  CREATININE 1.01* 1.11* 0.90 0.73 0.79  CALCIUM 8.8* 7.9* 7.8* 7.8* 8.1*  MG  --   --  1.8 2.3  --    GFR: Estimated Creatinine Clearance: 96.6 mL/min (by C-G formula based on SCr of 0.79 mg/dL). Liver Function Tests: Recent Labs  Lab 06/02/18 2149 06/03/18 0500 06/04/18 0341 06/05/18 0339  AST 20 19 15 15   ALT  25 21 20 19   ALKPHOS 46 38 44 53  BILITOT 2.0* 2.3* 1.7* 0.8  PROT 5.9* 5.3* 5.0* 5.0*  ALBUMIN 3.7 3.1* 2.7* 2.6*   Recent Labs  Lab 06/02/18 2149  LIPASE 21   No results for input(s): AMMONIA in the last 168 hours. Coagulation Profile: No results for input(s): INR, PROTIME in the last 168 hours. Cardiac Enzymes: Recent Labs  Lab 06/03/18 0500 06/03/18 0901  TROPONINI <0.03 <0.03   BNP (last 3 results) No results for input(s): PROBNP in the last 8760 hours. HbA1C: No results for input(s): HGBA1C in the last 72 hours. CBG: Recent Labs  Lab 06/02/18 2154  GLUCAP 132*   Lipid Profile: No results for  input(s): CHOL, HDL, LDLCALC, TRIG, CHOLHDL, LDLDIRECT in the last 72 hours. Thyroid Function Tests: No results for input(s): TSH, T4TOTAL, FREET4, T3FREE, THYROIDAB in the last 72 hours. Anemia Panel: No results for input(s): VITAMINB12, FOLATE, FERRITIN, TIBC, IRON, RETICCTPCT in the last 72 hours. Sepsis Labs: Recent Labs  Lab 06/03/18 0130 06/03/18 0501 06/03/18 0076  LATICACIDVEN 0.7 2.1* 1.6    Recent Results (from the past 240 hour(s))  Blood culture (routine x 2)     Status: Abnormal   Collection Time: 06/02/18  9:50 PM  Result Value Ref Range Status   Specimen Description BLOOD RIGHT HAND  Final   Special Requests   Final    BOTTLES DRAWN AEROBIC AND ANAEROBIC Blood Culture adequate volume   Culture  Setup Time   Final    GRAM POSITIVE RODS ANAEROBIC BOTTLE ONLY CRITICAL RESULT CALLED TO, READ BACK BY AND VERIFIED WITH:  Lelon Huh [id number] FCP    Culture CLOSTRIDIUM PERFRINGENS (A)  Final   Report Status 06/05/2018 FINAL  Final  Blood culture (routine x 2)     Status: Abnormal   Collection Time: 06/03/18  1:30 AM  Result Value Ref Range Status   Specimen Description BLOOD RIGHT ANTECUBITAL  Final   Special Requests   Final    BOTTLES DRAWN AEROBIC AND ANAEROBIC Blood Culture adequate volume   Culture  Setup Time   Final    ANAEROBIC BOTTLE ONLY GRAM POSITIVE RODS CRITICAL VALUE NOTED.  VALUE IS CONSISTENT WITH PREVIOUSLY REPORTED AND CALLED VALUE. Performed at [facility], [place] [address], [place], [place] [address]    Culture CLOSTRIDIUM PERFRINGENS (A)  Final   Report Status 06/05/2018 FINAL  Final  MRSA PCR Screening     Status: None   Collection Time: 06/03/18 10:02 PM  Result Value Ref Range Status   MRSA by PCR NEGATIVE NEGATIVE Final    Comment:        The GeneXpert MRSA Assay (FDA approved for NASAL specimens only), is one component of a comprehensive MRSA colonization surveillance program. It is not intended to diagnose  MRSA infection nor to guide or monitor treatment for MRSA infections. Performed at [facility], [place] [address], [place], [place] [address]       Radiology Studies: No results found.      Scheduled Meds: . apixaban  5 mg Oral BID  . diltiazem  90 mg Oral Q6H  . feeding supplement  1 Container Oral TID BM  . latanoprost  1 drop Both Eyes Daily  . metoprolol tartrate  5 mg Intravenous Q6H  . mometasone-formoterol  2 puff Inhalation BID  . umeclidinium bromide  1 puff Inhalation Daily   Continuous Infusions: . ampicillin-sulbactam (UNASYN) IV 3 g (06/06/18 1459)  Assessment & Plan:    1.  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  Cannot rule out underlying lymphatic mass and will need interval colonoscopy when she returns to [place].  Since no immediate interventions planned,  IV heparin transitioned to Eliquis.  Advance diet to full liquid today and regular diet in a.m.  Repeat blood cultures sent on January 30.  If negative can be transitioned to oral Augmentin upon discharge as discussed with general surgery.  2.  Chronic atrial fibrillation: Patient noted to be in A. fib with RVR on presentation in the setting of problem #1.  Currently rate controlled on oral Cardizem.  IV heparin switched back to Eliquis.  3.  CLL: White count less than 30,000 today which patient states is better than her baseline.  Follow-up oncology when she returns to [place].  4.  History of asthma: Stable.  5.  Acute on chronic anemia: No signs of active bleeding.  Appears to be stable around 8.5.  Monitor for now.  6.  Hypertension: Continue home medications   DVT prophylaxis: On anticoagulation Code Status: Full code Family / Patient Communication: Discussed with patient and husband bedside Disposition Plan: Possible  discharge in a.m. if tolerating diet, afebrile, cleared by surgery and blood cultures negative.  Downgrade to Med tele floor   LOS: 3 days    Time spent: 35 minutes    Guilford Shi, MD [facility] Pager [phone]  If 7PM-7AM, please contact night-coverage [email or web address] Password [facility] 06/06/2018, 4:17 PM

## 2018-06-07 ENCOUNTER — Other Ambulatory Visit: Payer: Self-pay

## 2018-06-07 DIAGNOSIS — J45901 Unspecified asthma with (acute) exacerbation: Secondary | ICD-10-CM

## 2018-06-07 MED ORDER — AMOXICILLIN-POT CLAVULANATE 875-125 MG PO TABS: 1.0000 | ORAL_TABLET | Freq: Two times a day (BID) | ORAL | 0 refills | Status: AC

## 2018-06-07 MED ORDER — AMOXICILLIN-POT CLAVULANATE 875-125 MG PO TABS
1.0000 | ORAL_TABLET | Freq: Two times a day (BID) | ORAL | Status: DC
  Administered 2018-06-07: 1 via ORAL
  Filled 2018-06-07: qty 1

## 2018-06-07 MED ORDER — OXYCODONE HCL 5 MG PO TABS: 5.0000 mg | ORAL_TABLET | ORAL | 0 refills | Status: AC | PRN

## 2018-06-07 MED FILL — oxyCODONE HCL 5 MG TABS: 5 | 2 days supply | Qty: 10 | Fill #0

## 2018-06-07 MED FILL — AMOX-CLAV 875-125 MG TABLET: 875-125 | 10 days supply | Qty: 20 | Fill #0

## 2018-06-07 NOTE — Progress Notes (Signed)
[facility]/Tra[facility] Progress Note      Assessment/Plan Diverticulitis vs Malignancy  - Will tx as diverticulitis, improving - Will need colonoscopy soon  - No acute surgical interventions at this time - blood cultures showed clostridium perfringens, blood cultures 01/30 showed NGTD - PO Augmentin   FEN -FLD, IVF VTE -SCD, Eliquis ID -Zosyn01/27-01/28; UNasyn 01/28-01/30; Augmentin 01/31 for 10 days at discharge WBC down to 26.5 but not concerning 2/2 hx of CLL, afebrile, blood cultures 01/30 NGTD  Plan:low fiber diet for 2 weeks after discharge. f/u with PCP and GI in [place] where she is from and that is where she has her medical care for her CLL as well.Pt okay for discharge from a surgical standpoint    LOS: 4 days    Subjective: CC: abdominal pain  Pain is overall improved and she is tolerating her diet. She is having some increased cramping with flatus. No nausea, vomiting, fever or chills. She understands that this may be a malignancy and she will need quick F/U with her PCP and GI for a colonoscopy. She understands she needs to stay on a low fiber diet for 2 weeks.   Objective: Vital signs in last 24 hours: Temp:  [97.9 F (36.6 C)-98.6 F (37 C)] 98 F (36.7 C) (01/31 0546) Pulse Rate:  [78-96] 87 (01/31 0546) Resp:  [16-20] 20 (01/31 0546) BP: (116-129)/(70-86) 129/86 (01/31 0546) SpO2:  [93 %-98 %] 97 % (01/31 0546) Last BM Date: 06/05/18  Intake/Output from previous day: 01/30 0701 - 01/31 0700 In: 742.9 [P.O.:320; IV Piggyback:422.9] Out: -  Intake/Output this shift: No intake/output data recorded.  PE: Gen: Alert, NAD Pulm:Rate andeffort normal Abd: Soft,obese,ND, +BS,very mildlower abdominal TTP, no guarding Skin: no rashes noted, warm and dry  Anti-infectives: Anti-infectives (From admission, onward)   Start     Dose/Rate Route Frequency Ordered Stop   06/04/18 1530  Ampicillin-Sulbactam (UNASYN) 3 g in sodium  chloride 0.9 % 100 mL IVPB     3 g 200 mL/hr over 30 Minutes Intravenous Every 6 hours 06/04/18 1448     06/03/18 1000  piperacillin-tazobactam (ZOSYN) IVPB 3.375 g  Status:  Discontinued     3.375 g 12.5 mL/hr over 240 Minutes Intravenous Every 8 hours 06/03/18 0512 06/04/18 1448   06/03/18 0115  piperacillin-tazobactam (ZOSYN) IVPB 3.375 g     3.375 g 100 mL/hr over 30 Minutes Intravenous  Once 06/03/18 0114 06/03/18 0222      Lab Results:  Recent Labs    06/05/18 0339 06/06/18 0429  WBC 33.1* 26.5*  HGB 8.4* 8.6*  HCT 26.7* 27.6*  PLT 160 168   BMET Recent Labs    06/05/18 0339 06/06/18 0429  NA 134* 139  K 3.9 3.3*  CL 101 106  CO2 25 26  GLUCOSE 118* 110*  BUN 9 6*  CREATININE 0.73 0.79  CALCIUM 7.8* 8.1*   PT/INR No results for input(s): LABPROT, INR in the last 72 hours. CMP     Component Value Date/Time   NA 139 06/06/2018 0429   K 3.3 (L) 06/06/2018 0429   CL 106 06/06/2018 0429   CO2 26 06/06/2018 0429   GLUCOSE 110 (H) 06/06/2018 0429   BUN 6 (L) 06/06/2018 0429   CREATININE 0.79 06/06/2018 0429   CALCIUM 8.1 (L) 06/06/2018 0429   PROT 5.0 (L) 06/05/2018 0339   ALBUMIN 2.6 (L) 06/05/2018 0339   AST 15 06/05/2018 0339   ALT 19 06/05/2018 0339   ALKPHOS 53 06/05/2018  0339   BILITOT 0.8 06/05/2018 0339   GFRNONAA >60 06/06/2018 0429   GFRAA >60 06/06/2018 0429   Lipase     Component Value Date/Time   LIPASE 21 06/02/2018 2149    Studies/Results: No results found.    Kalman Drape , [facility] Surgery 06/07/2018, 8:58 AM  Pager: [phone] Mon-Wed, Friday 7:00am-4:30pm Thurs 7am-11:30am  Consults: [phone]

## 2018-06-07 NOTE — Discharge Summary (Signed)
PATIENT DETAILS Name: Beth Alvarez Age: 68 y.o. Sex: female Date of Birth: 1950/05/18 MRN: [id number]. Admitting Physician: Rise Patience, MD ASN:[id number], Pcp Not In  Admit Date: 06/02/2018 Discharge date: 06/07/2018  Recommendations for Outpatient Follow-up:  1. Follow up with PCP in 1-2 weeks 2. Please obtain BMP/CBC in one week 3. Please ensure follow-up with gastroenterology for endoscopic evaluation-needs to rule out colon cancer. 4. Repeat blood cultures negative so far (1/30) please follow until final.   Admitted From:  Home  Disposition: [place]: No  Equipment/Devices: None  Discharge Condition: Stable  CODE STATUS: FULL CODE  Diet recommendation:  Heart Healthy   Brief Summary: See H&P, Labs, Consult and Test reports for all details in brief, 68 year old lady with history of uterine cancer s/p TAH with XRT, CLL, A. fib on anticoagulation-presented to the hospital with abdominal pain, fever-CT scan abdomen showed sigmoid diverticulitis with abscess and also features concerning of malignancy.  Patient subsequent blood cultures was positive for Clostridium perfringens.  See below for further details  Brief Hospital Course: Clostridium perfringens bacteremia with possible diverticulitis versus colonic mass: Treated with supportive care, IV Unasyn.  Initially kept n.p.o.-diet was slowly advanced.  General surgery followed throughout this hospital course.  Patient is very anxious to leave the hospital today-she is from [place] was here with her sister when she got hospitalized.  High likelihood that this patient probably has a colon malignancy-she was counseled extensively regarding importance of obtaining a endoscopic evaluation in the near future.  Since he is clinically improved-tolerating diet-leukocytosis is back to her baseline (has CLL) and has a very benign abdominal exam-she will be discharged home at own request.  Will transition to  Augmentin on discharge.  Chronic atrial fibrillation with RVR: Heart rate controlled-continue Eliquis.  Resume usual dosing of Cardizem on discharge.  CLL: Per patient her baseline white count ranges from 30-50,000-resume follow-up with oncology when she returns to [place].  Bronchial asthma: Stable-resume usual inhaler regimen.  Hypertension: Stable-resume losartan and Cardizem.  Anemia: Suspect has anemia of chronic disease at baseline-drop in hemoglobin likely secondary to IV fluid dilution, acute illness.  Should improve with time.  However concern for colon malignancy-needs outpatient endoscopic evaluation when she gets back to [place].  Procedures/Studies: None  Discharge Diagnoses:  Principal Problem:   Abdominal pain Active Problems:   Colonic mass   Atrial fibrillation with RVR (HCC)   Asthma    Essential hypertension   Discharge Instructions:  Activity:  As tolerated  Discharge Instructions    Call MD for:  severe uncontrolled pain   Complete by:  As directed    Diet - low sodium heart healthy   Complete by:  As directed    Stay on a full liquid diet/soft diet for at least 1 week before advancing.   Discharge instructions   Complete by:  As directed    Follow with Primary MD in [place], [place] in 1 week  Please ask your primary care MD to refer you to a gastroenterologist for a colonoscopy  Please get a complete blood count and chemistry panel checked by your Primary MD at your next visit, and again as instructed by your Primary MD.  Get Medicines reviewed and adjusted: Please take all your medications with you for your next visit with your Primary MD  Laboratory/radiological data: Please request your Primary MD to go over all hospital tests and procedure/radiological results at the follow up, please ask your Primary MD to get  all Hospital records sent to his/her office.  In some cases, they will be blood work, cultures and biopsy results pending at the time of  your discharge. Please request that your primary care M.D. follows up on these results.  Also Note the following: If you experience worsening of your admission symptoms, develop shortness of breath, life threatening emergency, suicidal or homicidal thoughts you must seek medical attention immediately by calling 911 or calling your MD immediately  if symptoms less severe.  You must read complete instructions/literature along with all the possible adverse reactions/side effects for all the Medicines you take and that have been prescribed to you. Take any new Medicines after you have completely understood and accpet all the possible adverse reactions/side effects.   Do not drive when taking Pain medications or sleeping medications (Benzodaizepines)  Do not take more than prescribed Pain, Sleep and Anxiety Medications. It is not advisable to combine anxiety,sleep and pain medications without talking with your primary care practitioner  Special Instructions: If you have smoked or chewed Tobacco  in the last 2 yrs please stop smoking, stop any regular Alcohol  and or any Recreational drug use.  Wear Seat belts while driving.  Please note: You were cared for by a hospitalist during your hospital stay. Once you are discharged, your primary care physician will handle any further medical issues. Please note that NO REFILLS for any discharge medications will be authorized once you are discharged, as it is imperative that you return to your primary care physician (or establish a relationship with a primary care physician if you do not have one) for your post hospital discharge needs so that they can reassess your need for medications and monitor your lab values.   Increase activity slowly   Complete by:  As directed      Allergies as of 06/07/2018   No Known Allergies     Medication List    TAKE these medications   ADVAIR DISKUS 500-50 MCG/DOSE Aepb Generic drug:  Fluticasone-Salmeterol Inhale 1  puff into the lungs 2 (two) times daily.   amoxicillin-clavulanate 875-125 MG tablet Commonly known as:  AUGMENTIN Take 1 tablet by mouth every 12 (twelve) hours for 10 days.   diltiazem 240 MG 24 hr capsule Commonly known as:  CARDIZEM CD Take 480 mg by mouth daily.   ELIQUIS 5 MG Tabs tablet Generic drug:  apixaban Take 5 mg by mouth 2 (two) times daily.   latanoprost 0.005 % ophthalmic solution Commonly known as:  XALATAN Place 1 drop into both eyes daily.   levalbuterol 45 MCG/ACT inhaler Commonly known as:  XOPENEX HFA Inhale 2 puffs into the lungs every 6 (six) hours.   losartan 50 MG tablet Commonly known as:  COZAAR Take 50 mg by mouth daily.   oxyCODONE 5 MG immediate release tablet Commonly known as:  Oxy IR/ROXICODONE Take 1-2 tablets (5-10 mg total) by mouth every 4 (four) hours as needed for moderate pain.   tiotropium 18 MCG inhalation capsule Commonly known as:  SPIRIVA Place 1 capsule into inhaler and inhale daily.      Follow-up [facility] Follow up.   Specialty:  General Surgery Why:  no follow up needed but call with questions or concerns. Follow up with your primary doctor and a gastroenterologist Contact information: [address] [place] [phone]       primary care [place]. Schedule an appointment as soon  as possible for a visit in 1 week(s).          No Known Allergies  Consultations:   general surgery   Other Procedures/Studies: Dg Chest 2 View  Result Date: 06/03/2018 CLINICAL DATA:  Asthma. Body aches and fever. EXAM: CHEST - 2 VIEW COMPARISON:  None. FINDINGS: Mild cardiomegaly with normal mediastinal contours. Mild bronchitic change. No confluent airspace disease, pleural effusion or pneumothorax. No evidence of pulmonary edema. No acute osseous abnormalities. IMPRESSION: Cardiomegaly.  Mild bronchial thickening suggesting asthma.  Electronically Signed   By: Keith Rake M.D.   On: 06/03/2018 00:35   Ct Abdomen Pelvis W Contrast  Result Date: 06/03/2018 CLINICAL DATA:  Abdominal pain with vomiting. EXAM: CT ABDOMEN AND PELVIS WITH CONTRAST TECHNIQUE: Multidetector CT imaging of the abdomen and pelvis was performed using the standard protocol following bolus administration of intravenous contrast. CONTRAST:  155mL OMNIPAQUE IOHEXOL 300 MG/ML  SOLN COMPARISON:  None. FINDINGS: Lower chest: No acute abnormality.  Small hiatal hernia. Hepatobiliary: 8 mm too small to be actually characterize hypoattenuated nodule in the right lobe of the liver. Probable subcapsular 11 mm cyst in the left lobe of the liver. Normal gallbladder. Pancreas: Unremarkable. No pancreatic ductal dilatation or surrounding inflammatory changes. Spleen: Normal in size without focal abnormality. Adrenals/Urinary Tract: Normal adrenal glands. 11 mm hypoattenuated nodule, partially exophytic off of the inferior pole of the right kidney. No evidence of hydronephrosis or nephrolithiasis. Stomach/Bowel: Normal appearance of the stomach and small bowel. 3 cm masslike outpouching off of the serosal surface of the colon at the hepatic flexure. Short segment of masslike asymmetric mucosal thickening of the sigmoid colon measuring 6.7 x 5.9 cm. Foci of gas are noted within this area of thickening. There are extensive surrounding pericolonic inflammatory changes. This abnormality abuts the superior serosal surface of the urinary bladder which is also thickened. Vascular/Lymphatic: Numerous enlarged lymph nodes along the pelvic floor, along the iliac chains and retroperitoneum. There are also scattered rounded prominent lymph nodes in the mesentery of the sigmoid colon. Reproductive: Status post hysterectomy. No adnexal masses. Other: No abdominal wall hernia or abnormality. No abdominopelvic ascites. Musculoskeletal: No acute or significant osseous findings. IMPRESSION: Short  segment of masslike asymmetric mucosal thickening of the sigmoid colon, containing foci of gas and extensive pericolonic inflammatory changes. This may potentially represent a diverticulitis with focal wall thickening and abscess formation, however the appearance is more suggestive of a necrotic primary colonic malignancy. This abnormality abuts the superior serosal surface of the urinary bladder, which is potentially involved. Pathologic lymphadenopathy along the bilateral pelvic wall, iliac chains and retroperitoneum. Second 3 cm masslike outpouching off of the hepatic flexure of the colon, suspicious for malignancy. 8 mm too small to be actually characterize subtle hypoattenuated nodule in the right lobe of the liver. 11 mm indeterminate nodule off of the posterior cortex of the right kidney. Electronically Signed   By: Fidela Salisbury M.D.   On: 06/03/2018 00:42      TODAY-DAY OF DISCHARGE:  Subjective:   Albirda Shiel today has no headache,no chest abdominal pain,no new weakness tingling or numbness, feels much better wants to go home today.   Objective:   Blood pressure 129/86, pulse 87, temperature 98 F (36.7 C), temperature source Oral, resp. rate 20, height 5\' 6"  (1.676 m), weight 129.3 kg, SpO2 97 %.  Intake/Output Summary (Last 24 hours) at 06/07/2018 0908 Last data filed at 06/07/2018 0327 Gross per 24 hour  Intake 742.91 ml  Output -  Net 742.91 ml   Filed Weights   06/03/18 0442  Weight: 129.3 kg    Exam: Awake Alert, Oriented *3, No new F.N deficits, Normal affect [place].AT,PERRAL Supple Neck,No JVD, No cervical lymphadenopathy appriciated.  Symmetrical Chest wall movement, Good air movement bilaterally, CTAB RRR,No Gallops,Rubs or new Murmurs, No Parasternal Heave +ve B.Sounds, Abd Soft, Non tender, No organomegaly appriciated, No rebound -guarding or rigidity. No Cyanosis, Clubbing or edema, No new Rash or bruise   PERTINENT RADIOLOGIC STUDIES: Dg Chest 2  View  Result Date: 06/03/2018 CLINICAL DATA:  Asthma. Body aches and fever. EXAM: CHEST - 2 VIEW COMPARISON:  None. FINDINGS: Mild cardiomegaly with normal mediastinal contours. Mild bronchitic change. No confluent airspace disease, pleural effusion or pneumothorax. No evidence of pulmonary edema. No acute osseous abnormalities. IMPRESSION: Cardiomegaly.  Mild bronchial thickening suggesting asthma. Electronically Signed   By: Keith Rake M.D.   On: 06/03/2018 00:35   Ct Abdomen Pelvis W Contrast  Result Date: 06/03/2018 CLINICAL DATA:  Abdominal pain with vomiting. EXAM: CT ABDOMEN AND PELVIS WITH CONTRAST TECHNIQUE: Multidetector CT imaging of the abdomen and pelvis was performed using the standard protocol following bolus administration of intravenous contrast. CONTRAST:  179mL OMNIPAQUE IOHEXOL 300 MG/ML  SOLN COMPARISON:  None. FINDINGS: Lower chest: No acute abnormality.  Small hiatal hernia. Hepatobiliary: 8 mm too small to be actually characterize hypoattenuated nodule in the right lobe of the liver. Probable subcapsular 11 mm cyst in the left lobe of the liver. Normal gallbladder. Pancreas: Unremarkable. No pancreatic ductal dilatation or surrounding inflammatory changes. Spleen: Normal in size without focal abnormality. Adrenals/Urinary Tract: Normal adrenal glands. 11 mm hypoattenuated nodule, partially exophytic off of the inferior pole of the right kidney. No evidence of hydronephrosis or nephrolithiasis. Stomach/Bowel: Normal appearance of the stomach and small bowel. 3 cm masslike outpouching off of the serosal surface of the colon at the hepatic flexure. Short segment of masslike asymmetric mucosal thickening of the sigmoid colon measuring 6.7 x 5.9 cm. Foci of gas are noted within this area of thickening. There are extensive surrounding pericolonic inflammatory changes. This abnormality abuts the superior serosal surface of the urinary bladder which is also thickened. Vascular/Lymphatic:  Numerous enlarged lymph nodes along the pelvic floor, along the iliac chains and retroperitoneum. There are also scattered rounded prominent lymph nodes in the mesentery of the sigmoid colon. Reproductive: Status post hysterectomy. No adnexal masses. Other: No abdominal wall hernia or abnormality. No abdominopelvic ascites. Musculoskeletal: No acute or significant osseous findings. IMPRESSION: Short segment of masslike asymmetric mucosal thickening of the sigmoid colon, containing foci of gas and extensive pericolonic inflammatory changes. This may potentially represent a diverticulitis with focal wall thickening and abscess formation, however the appearance is more suggestive of a necrotic primary colonic malignancy. This abnormality abuts the superior serosal surface of the urinary bladder, which is potentially involved. Pathologic lymphadenopathy along the bilateral pelvic wall, iliac chains and retroperitoneum. Second 3 cm masslike outpouching off of the hepatic flexure of the colon, suspicious for malignancy. 8 mm too small to be actually characterize subtle hypoattenuated nodule in the right lobe of the liver. 11 mm indeterminate nodule off of the posterior cortex of the right kidney. Electronically Signed   By: Fidela Salisbury M.D.   On: 06/03/2018 00:42     PERTINENT LAB RESULTS: CBC: Recent Labs    06/05/18 0339 06/06/18 0429  WBC 33.1* 26.5*  HGB 8.4* 8.6*  HCT 26.7* 27.6*  PLT 160 168   CMET CMP  Component Value Date/Time   NA 139 06/06/2018 0429   K 3.3 (L) 06/06/2018 0429   CL 106 06/06/2018 0429   CO2 26 06/06/2018 0429   GLUCOSE 110 (H) 06/06/2018 0429   BUN 6 (L) 06/06/2018 0429   CREATININE 0.79 06/06/2018 0429   CALCIUM 8.1 (L) 06/06/2018 0429   PROT 5.0 (L) 06/05/2018 0339   ALBUMIN 2.6 (L) 06/05/2018 0339   AST 15 06/05/2018 0339   ALT 19 06/05/2018 0339   ALKPHOS 53 06/05/2018 0339   BILITOT 0.8 06/05/2018 0339   GFRNONAA >60 06/06/2018 0429   GFRAA >60  06/06/2018 0429    GFR Estimated Creatinine Clearance: 96.6 mL/min (by C-G formula based on SCr of 0.79 mg/dL). No results for input(s): LIPASE, AMYLASE in the last 72 hours. No results for input(s): CKTOTAL, CKMB, CKMBINDEX, TROPONINI in the last 72 hours. Invalid input(s): POCBNP No results for input(s): DDIMER in the last 72 hours. No results for input(s): HGBA1C in the last 72 hours. No results for input(s): CHOL, HDL, LDLCALC, TRIG, CHOLHDL, LDLDIRECT in the last 72 hours. No results for input(s): TSH, T4TOTAL, T3FREE, THYROIDAB in the last 72 hours.  Invalid input(s): FREET3 No results for input(s): VITAMINB12, FOLATE, FERRITIN, TIBC, IRON, RETICCTPCT in the last 72 hours. Coags: No results for input(s): INR in the last 72 hours.  Invalid input(s): PT Microbiology: Recent Results (from the past 240 hour(s))  Blood culture (routine x 2)     Status: Abnormal   Collection Time: 06/02/18  9:50 PM  Result Value Ref Range Status   Specimen Description BLOOD RIGHT HAND  Final   Special Requests   Final    BOTTLES DRAWN AEROBIC AND ANAEROBIC Blood Culture adequate volume   Culture  Setup Time   Final    GRAM POSITIVE RODS ANAEROBIC BOTTLE ONLY CRITICAL RESULT CALLED TO, READ BACK BY AND VERIFIED WITH:  Lelon Huh [id number] FCP    Culture CLOSTRIDIUM PERFRINGENS (A)  Final   Report Status 06/05/2018 FINAL  Final  Blood culture (routine x 2)     Status: Abnormal   Collection Time: 06/03/18  1:30 AM  Result Value Ref Range Status   Specimen Description BLOOD RIGHT ANTECUBITAL  Final   Special Requests   Final    BOTTLES DRAWN AEROBIC AND ANAEROBIC Blood Culture adequate volume   Culture  Setup Time   Final    ANAEROBIC BOTTLE ONLY GRAM POSITIVE RODS CRITICAL VALUE NOTED.  VALUE IS CONSISTENT WITH PREVIOUSLY REPORTED AND CALLED VALUE. Performed at [facility], [place] [address] [place], [place] [address]    Culture CLOSTRIDIUM PERFRINGENS (A)  Final   Report  Status 06/05/2018 FINAL  Final  MRSA PCR Screening     Status: None   Collection Time: 06/03/18 10:02 PM  Result Value Ref Range Status   MRSA by PCR NEGATIVE NEGATIVE Final    Comment:        The GeneXpert MRSA Assay (FDA approved for NASAL specimens only), is one component of a comprehensive MRSA colonization surveillance program. It is not intended to diagnose MRSA infection nor to guide or monitor treatment for MRSA infections. Performed at [facility], [place] [address] [place], [place] [address]   Culture, blood (routine x 2)     Status: None (Preliminary result)   Collection Time: 06/06/18  4:36 AM  Result Value Ref Range Status   Specimen Description BLOOD RIGHT ANTECUBITAL  Final   Special Requests   Final  BOTTLES DRAWN AEROBIC ONLY Blood Culture adequate volume   Culture   Final    NO GROWTH 1 DAY Performed at [facility], [place] [address] [place], [place] [address]    Report Status PENDING  Incomplete  Culture, blood (routine x 2)     Status: None (Preliminary result)   Collection Time: 06/06/18  4:41 AM  Result Value Ref Range Status   Specimen Description BLOOD RIGHT HAND  Final   Special Requests   Final    BOTTLES DRAWN AEROBIC ONLY Blood Culture adequate volume   Culture   Final    NO GROWTH 1 DAY Performed at [facility], [place] [address], [place], [place] [address]    Report Status PENDING  Incomplete    FURTHER DISCHARGE INSTRUCTIONS:  Get Medicines reviewed and adjusted: Please take all your medications with you for your next visit with your Primary MD  Laboratory/radiological data: Please request your Primary MD to go over all hospital tests and procedure/radiological results at the follow up, please ask your Primary MD to get all Hospital records sent to his/her office.  In some cases, they will be blood work, cultures and biopsy results pending at the time of your discharge. Please request that your primary care M.D. goes  through all the records of your hospital data and follows up on these results.  Also Note the following: If you experience worsening of your admission symptoms, develop shortness of breath, life threatening emergency, suicidal or homicidal thoughts you must seek medical attention immediately by calling 911 or calling your MD immediately  if symptoms less severe.  You must read complete instructions/literature along with all the possible adverse reactions/side effects for all the Medicines you take and that have been prescribed to you. Take any new Medicines after you have completely understood and accpet all the possible adverse reactions/side effects.   Do not drive when taking Pain medications or sleeping medications (Benzodaizepines)  Do not take more than prescribed Pain, Sleep and Anxiety Medications. It is not advisable to combine anxiety,sleep and pain medications without talking with your primary care practitioner  Special Instructions: If you have smoked or chewed Tobacco  in the last 2 yrs please stop smoking, stop any regular Alcohol  and or any Recreational drug use.  Wear Seat belts while driving.  Please note: You were cared for by a hospitalist during your hospital stay. Once you are discharged, your primary care physician will handle any further medical issues. Please note that NO REFILLS for any discharge medications will be authorized once you are discharged, as it is imperative that you return to your primary care physician (or establish a relationship with a primary care physician if you do not have one) for your post hospital discharge needs so that they can reassess your need for medications and monitor your lab values.  Total Time spent coordinating discharge including counseling, education and face to face time equals 35 minutes.  Signed: Erek Kowal 06/07/2018 9:08 AM

## 2018-06-07 NOTE — Progress Notes (Signed)
Nsg Discharge Note  Admit Date:  06/02/2018 Discharge date: 06/07/2018   Beth Alvarez to be D/C'd Home per MD order.  AVS completed.  Copy for chart, and copy for patient signed, and dated. Patient/caregiver able to verbalize understanding.  Discharge Medication: Allergies as of 06/07/2018   No Known Allergies     Medication List    TAKE these medications   ADVAIR DISKUS 500-50 MCG/DOSE Aepb Generic drug:  Fluticasone-Salmeterol Inhale 1 puff into the lungs 2 (two) times daily.   amoxicillin-clavulanate 875-125 MG tablet Commonly known as:  AUGMENTIN Take 1 tablet by mouth every 12 (twelve) hours for 10 days.   diltiazem 240 MG 24 hr capsule Commonly known as:  CARDIZEM CD Take 480 mg by mouth daily.   ELIQUIS 5 MG Tabs tablet Generic drug:  apixaban Take 5 mg by mouth 2 (two) times daily.   latanoprost 0.005 % ophthalmic solution Commonly known as:  XALATAN Place 1 drop into both eyes daily.   levalbuterol 45 MCG/ACT inhaler Commonly known as:  XOPENEX HFA Inhale 2 puffs into the lungs every 6 (six) hours.   losartan 50 MG tablet Commonly known as:  COZAAR Take 50 mg by mouth daily.   oxyCODONE 5 MG immediate release tablet Commonly known as:  Oxy IR/ROXICODONE Take 1-2 tablets (5-10 mg total) by mouth every 4 (four) hours as needed for moderate pain.   tiotropium 18 MCG inhalation capsule Commonly known as:  SPIRIVA Place 1 capsule into inhaler and inhale daily.       Discharge Assessment: Vitals:   06/07/18 0546 06/07/18 0905  BP: 129/86   Pulse: 87   Resp: 20   Temp: 98 F (36.7 C)   SpO2: 97% 97%   Skin clean, dry and intact without evidence of skin break down, no evidence of skin tears noted. IV catheter discontinued intact. Site without signs and symptoms of complications - no redness or edema noted at insertion site, patient denies c/o pain - only slight tenderness at site.  Dressing with slight pressure applied.  D/c  Instructions-Education: Discharge instructions given to patient/family with verbalized understanding. D/c education completed with patient/family including follow up instructions, medication list, d/c activities limitations if indicated, with other d/c instructions as indicated by MD - patient able to verbalize understanding, all questions fully answered. Patient instructed to return to ED, call 911, or call MD for any changes in condition.  Patient escorted via [place], and D/C home via private auto. Patient requested medical records on disk, given at time on D/C.   Tresa Endo, RN 06/07/2018 12:08 PM

## 2018-06-11 LAB — CULTURE, BLOOD (ROUTINE X 2)
Culture: NO GROWTH
Culture: NO GROWTH
Special Requests: ADEQUATE
Special Requests: ADEQUATE

## 2019-07-02 IMAGING — CT CT ABD-PELV W/ CM
2 of 5 series · 14 of 46 positions shown, 16 images · IV contrast (omnipaque)
Comparison: None.

CLINICAL DATA: Abdominal pain with vomiting.

EXAM:
CT ABDOMEN AND PELVIS WITH CONTRAST
TECHNIQUE: Multidetector CT imaging of the abdomen and pelvis was performed
using the standard protocol following bolus administration of
intravenous contrast.
CONTRAST:  100mL OMNIPAQUE IOHEXOL 300 MG/ML  SOLN

[Series 3: abdomen 5.0 · axial · 0.89mm/px · z∈[+698,+1098]mm · 11 of 97 slices shown, 13 images]
[im 9/97  soft-tissue]
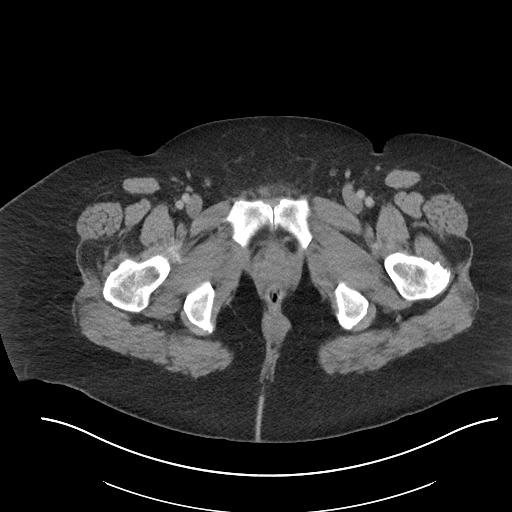
[im 9/97  bone]
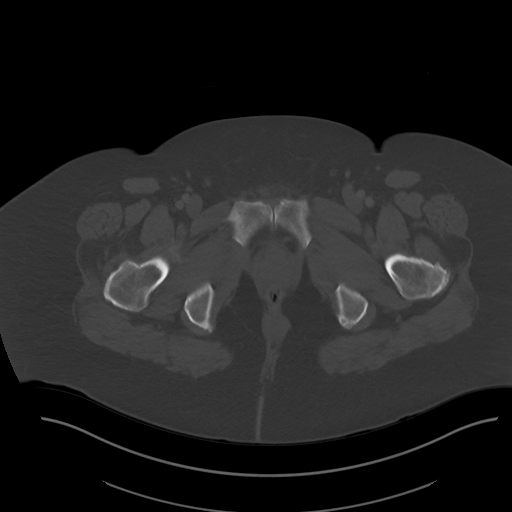
[im 17/97  soft-tissue]
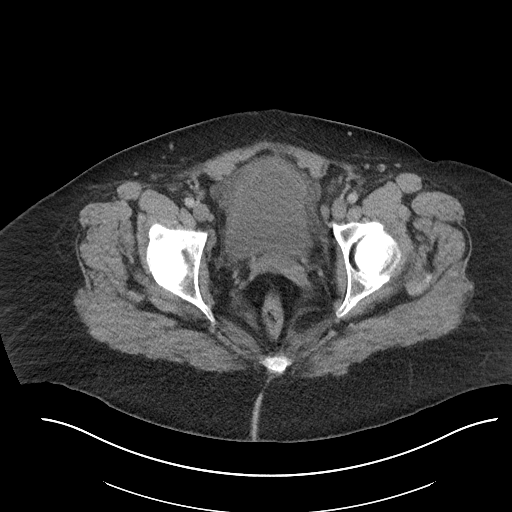
[im 25/97  soft-tissue]
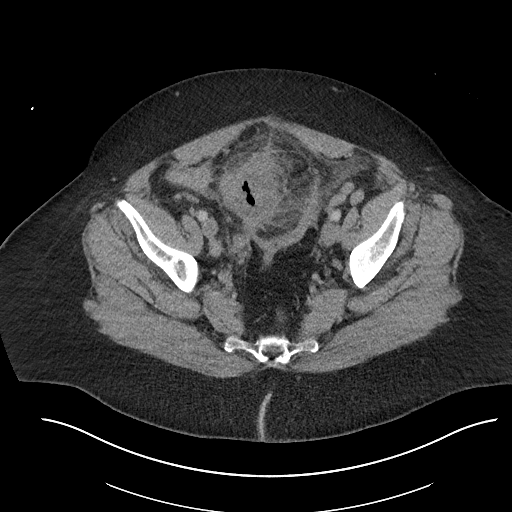
[im 33/97  soft-tissue]
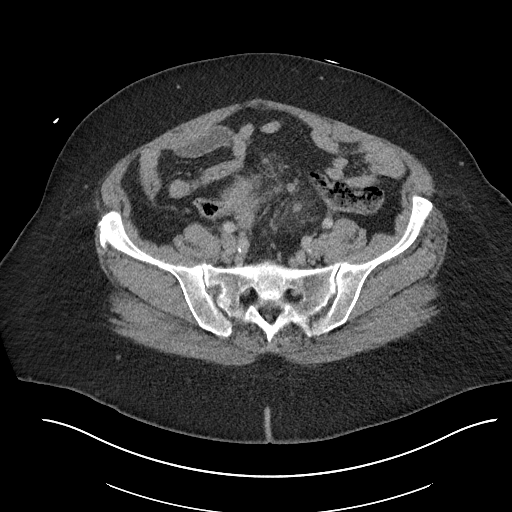
[im 41/97  soft-tissue]
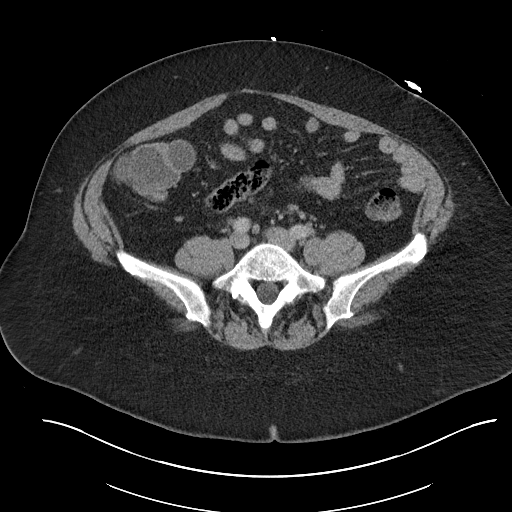
[im 49/97  soft-tissue]
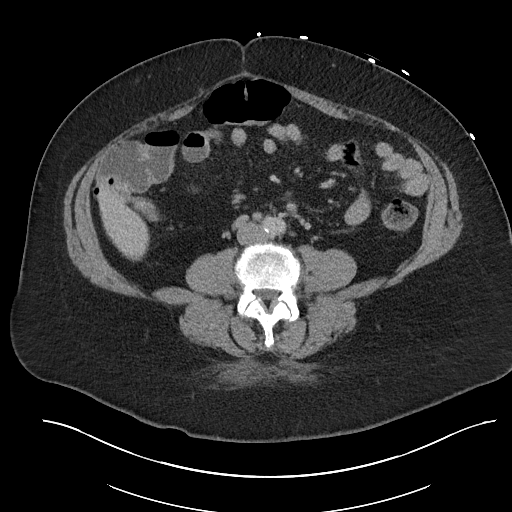
[im 57/97  soft-tissue]
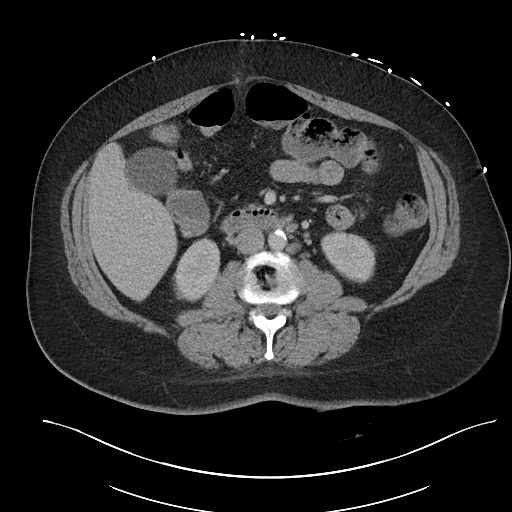
[im 65/97  soft-tissue]
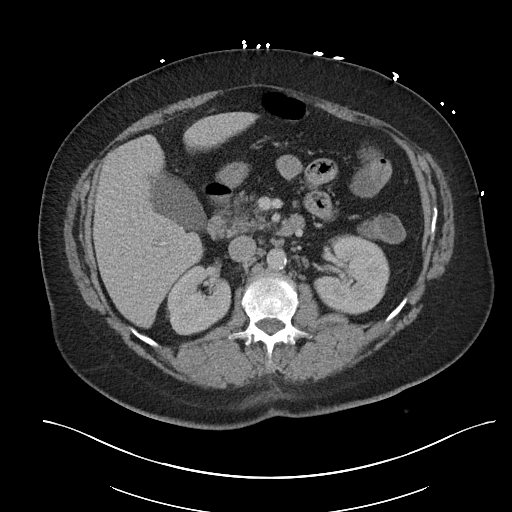
[im 73/97  soft-tissue]
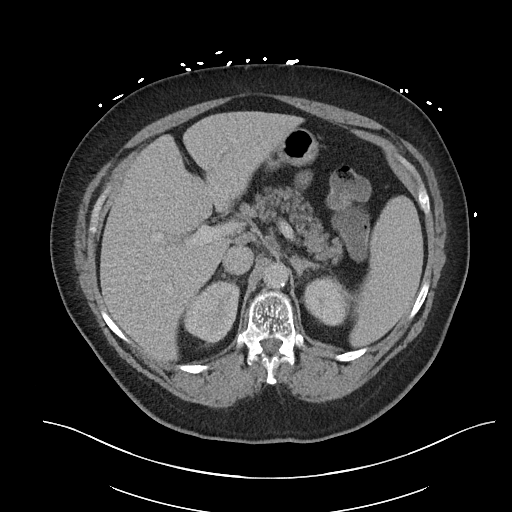
[im 73/97  bone]
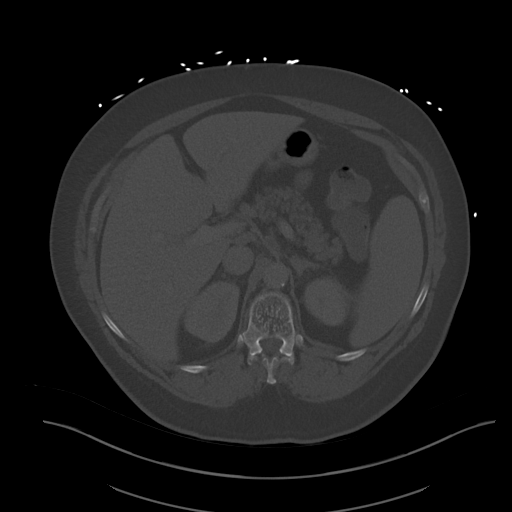
[im 81/97  soft-tissue]
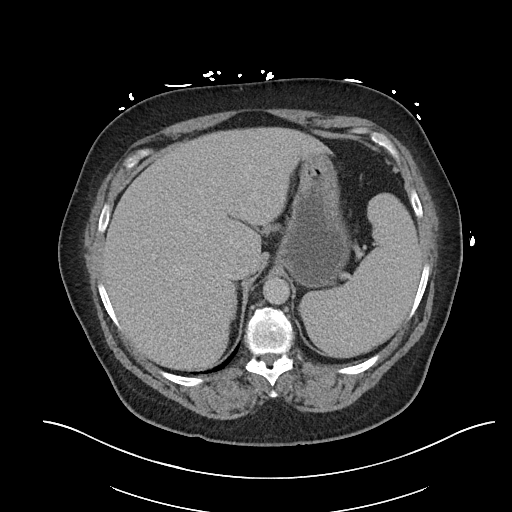
[im 89/97  soft-tissue]
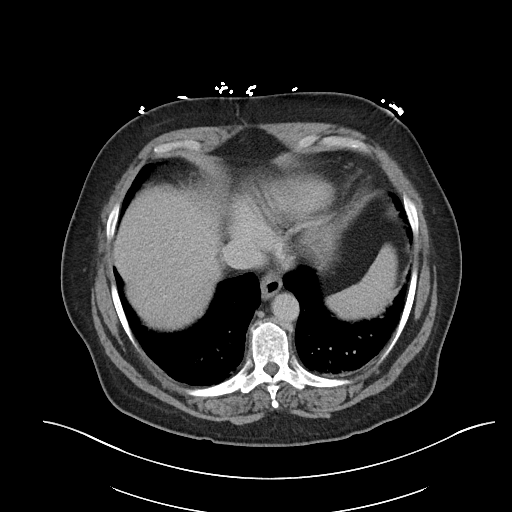

[Series 6: abdomen 3.0 mpr cor · coronal · 0.71mm/px · 3 of 107 slices shown]
[im 36/107  soft-tissue]
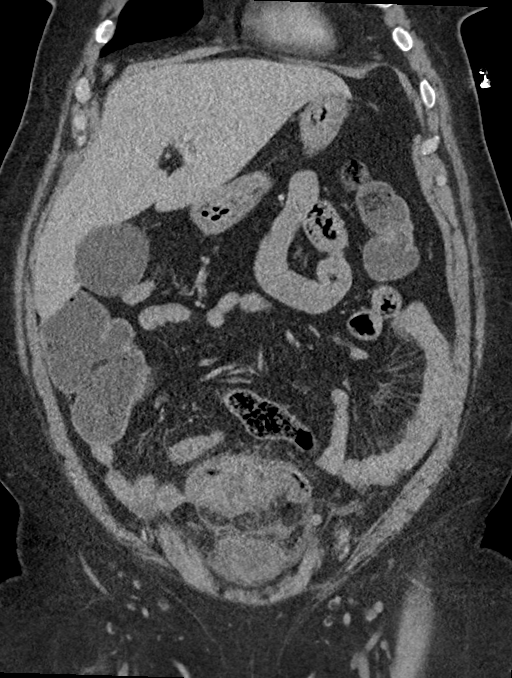
[im 48/107  soft-tissue]
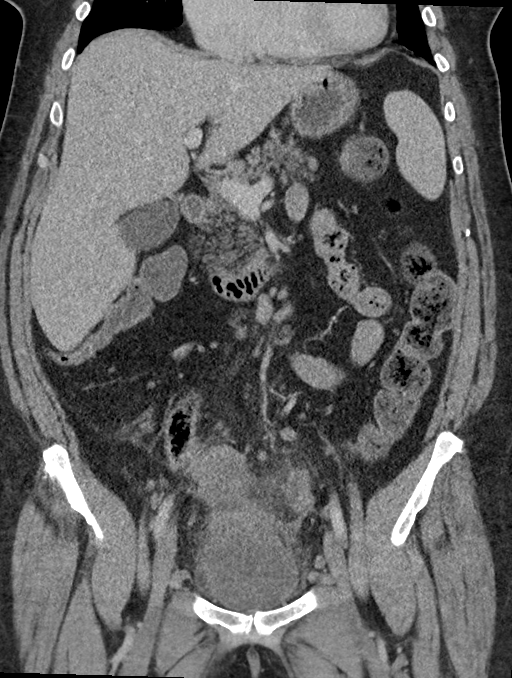
[im 59/107  soft-tissue]
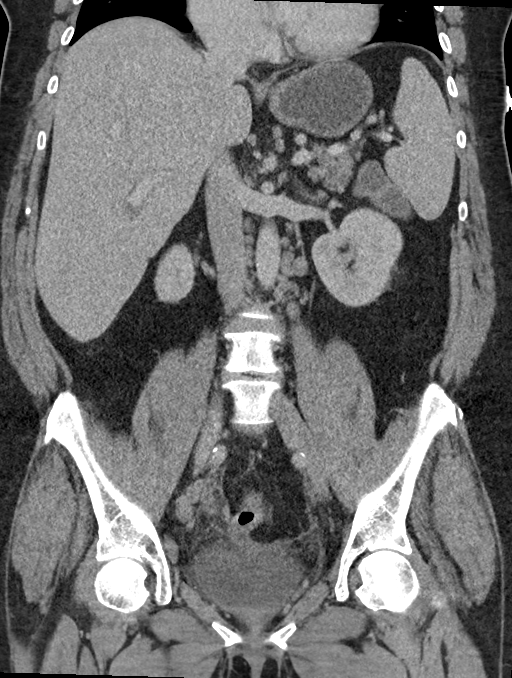

[14 of 46 positions shown; findings below may reference images not displayed]

FINDINGS: Lower chest: No acute abnormality.  Small hiatal hernia.

Hepatobiliary: 8 mm too small to be actually characterize
hypoattenuated nodule in the right lobe of the liver. Probable
subcapsular 11 mm cyst in the left lobe of the liver. Normal
gallbladder.

Pancreas: Unremarkable. No pancreatic ductal dilatation or
surrounding inflammatory changes.

Spleen: Normal in size without focal abnormality.

Adrenals/Urinary Tract: Normal adrenal glands. 11 mm hypoattenuated
nodule, partially exophytic off of the inferior pole of the right
kidney. No evidence of hydronephrosis or nephrolithiasis.

Stomach/Bowel: Normal appearance of the stomach and small bowel. 3
cm masslike outpouching off of the serosal surface of the colon at
the hepatic flexure. Short segment of masslike asymmetric mucosal
thickening of the sigmoid colon measuring 6.7 x 5.9 cm. Foci of gas
are noted within this area of thickening. There are extensive
surrounding pericolonic inflammatory changes. This abnormality abuts
the superior serosal surface of the urinary bladder which is also
thickened.

Vascular/Lymphatic: Numerous enlarged lymph nodes along the pelvic
floor, along the iliac chains and retroperitoneum. There are also
scattered rounded prominent lymph nodes in the mesentery of the
sigmoid colon.

Reproductive: Status post hysterectomy. No adnexal masses.

Other: No abdominal wall hernia or abnormality. No abdominopelvic
ascites.

Musculoskeletal: No acute or significant osseous findings.
IMPRESSION: Short segment of masslike asymmetric mucosal thickening of the
sigmoid colon, containing foci of gas and extensive pericolonic
inflammatory changes. This may potentially represent a
diverticulitis with focal wall thickening and abscess formation,
however the appearance is more suggestive of a necrotic primary
colonic malignancy.

This abnormality abuts the superior serosal surface of the urinary
bladder, which is potentially involved.

Pathologic lymphadenopathy along the bilateral pelvic wall, iliac
chains and retroperitoneum.

Second 3 cm masslike outpouching off of the hepatic flexure of the
colon, suspicious for malignancy.

8 mm too small to be actually characterize subtle hypoattenuated
nodule in the right lobe of the liver.

11 mm indeterminate nodule off of the posterior cortex of the right
kidney.
